# Patient Record
Sex: Male | Born: 1955 | Race: White | Hispanic: No | Marital: Single | Smoking: Never smoker
Health system: Southern US, Community
[De-identification: ages and names within clinical notes are randomized; demographics above are authoritative.]

## PROBLEM LIST (undated history)

## (undated) DIAGNOSIS — I10 Essential (primary) hypertension: Secondary | ICD-10-CM

## (undated) DIAGNOSIS — G4730 Sleep apnea, unspecified: Secondary | ICD-10-CM

## (undated) DIAGNOSIS — E785 Hyperlipidemia, unspecified: Secondary | ICD-10-CM

## (undated) HISTORY — DX: Hyperlipidemia, unspecified: E78.5

## (undated) HISTORY — PX: CHOLECYSTECTOMY: SHX55

---

## 2006-12-31 LAB — HM COLONOSCOPY

## 2010-12-31 HISTORY — PX: HERNIA REPAIR: SHX51

## 2011-03-20 ENCOUNTER — Ambulatory Visit: Payer: Self-pay | Admitting: Surgery

## 2011-03-27 ENCOUNTER — Ambulatory Visit: Payer: Self-pay | Admitting: Surgery

## 2011-08-06 ENCOUNTER — Ambulatory Visit: Payer: Self-pay | Admitting: Internal Medicine

## 2013-10-20 ENCOUNTER — Ambulatory Visit: Payer: Self-pay | Admitting: Internal Medicine

## 2013-11-04 ENCOUNTER — Ambulatory Visit: Payer: Self-pay | Admitting: Internal Medicine

## 2013-11-12 ENCOUNTER — Ambulatory Visit: Payer: Self-pay | Admitting: Internal Medicine

## 2013-12-31 LAB — LIPID PANEL: LDL Cholesterol: 82 mg/dL

## 2014-11-30 ENCOUNTER — Ambulatory Visit: Payer: Self-pay | Admitting: Internal Medicine

## 2014-11-30 LAB — LIPID PANEL
CHOLESTEROL: 142 mg/dL (ref 0–200)
HDL: 37 mg/dL (ref 35–70)
Triglycerides: 115 mg/dL (ref 40–160)

## 2014-11-30 LAB — PSA: PSA: 2.1

## 2015-04-06 ENCOUNTER — Encounter: Payer: Self-pay | Admitting: Internal Medicine

## 2015-04-06 DIAGNOSIS — I10 Essential (primary) hypertension: Secondary | ICD-10-CM | POA: Insufficient documentation

## 2015-04-06 DIAGNOSIS — Z86010 Personal history of colonic polyps: Secondary | ICD-10-CM | POA: Insufficient documentation

## 2015-04-06 DIAGNOSIS — M170 Bilateral primary osteoarthritis of knee: Secondary | ICD-10-CM | POA: Insufficient documentation

## 2015-04-06 DIAGNOSIS — J309 Allergic rhinitis, unspecified: Secondary | ICD-10-CM | POA: Insufficient documentation

## 2015-04-06 DIAGNOSIS — G4733 Obstructive sleep apnea (adult) (pediatric): Secondary | ICD-10-CM | POA: Insufficient documentation

## 2015-04-06 DIAGNOSIS — F324 Major depressive disorder, single episode, in partial remission: Secondary | ICD-10-CM | POA: Insufficient documentation

## 2015-04-06 DIAGNOSIS — Z8249 Family history of ischemic heart disease and other diseases of the circulatory system: Secondary | ICD-10-CM | POA: Insufficient documentation

## 2015-04-06 DIAGNOSIS — N138 Other obstructive and reflux uropathy: Secondary | ICD-10-CM | POA: Insufficient documentation

## 2015-04-06 DIAGNOSIS — N401 Enlarged prostate with lower urinary tract symptoms: Secondary | ICD-10-CM

## 2015-04-06 DIAGNOSIS — L209 Atopic dermatitis, unspecified: Secondary | ICD-10-CM | POA: Insufficient documentation

## 2015-06-01 ENCOUNTER — Other Ambulatory Visit: Payer: Self-pay | Admitting: Internal Medicine

## 2015-06-01 ENCOUNTER — Encounter: Payer: Self-pay | Admitting: Internal Medicine

## 2015-06-01 ENCOUNTER — Ambulatory Visit (INDEPENDENT_AMBULATORY_CARE_PROVIDER_SITE_OTHER): Payer: No Typology Code available for payment source | Admitting: Internal Medicine

## 2015-06-01 VITALS — BP 122/86 | HR 88 | Ht 72.0 in | Wt 222.0 lb

## 2015-06-01 DIAGNOSIS — I10 Essential (primary) hypertension: Secondary | ICD-10-CM

## 2015-06-01 DIAGNOSIS — E785 Hyperlipidemia, unspecified: Secondary | ICD-10-CM | POA: Diagnosis not present

## 2015-06-01 DIAGNOSIS — M170 Bilateral primary osteoarthritis of knee: Secondary | ICD-10-CM

## 2015-06-01 DIAGNOSIS — L309 Dermatitis, unspecified: Secondary | ICD-10-CM | POA: Diagnosis not present

## 2015-06-01 DIAGNOSIS — F324 Major depressive disorder, single episode, in partial remission: Secondary | ICD-10-CM

## 2015-06-01 MED ORDER — ATORVASTATIN CALCIUM 10 MG PO TABS: 10.0000 mg | ORAL_TABLET | Freq: Every day | ORAL | Status: DC

## 2015-06-01 NOTE — Progress Notes (Signed)
Date:  06/01/2015   Name:  Alexander Patterson   DOB:  June 18, 1955   MRN:  [id number]  PCP:  Bari EdwardLaura Zeina Akkerman, MD    Chief Complaint: Hypertension; Depression; and Osteoarthritis   History of Present Illness:  This is a 60 y.o. male who is presenting for routine followup of medical problems.  Hypertension - currently taking losartan daily. No medication side effects.  No concerns about it.  If he misses a few days he does not have chest pain,  SOB, or edema.  He estimates that he takes it most of the time.  Depression - being treated with bupropion.  If he forgets to take it he gets agitated.  Overall he is satisfied with his response.  No problems noted. Some sleep issues related to general anxiety.  This is treated with advil PM.  Osteoarthritis of knees -  Stable symptoms - knees hurt on a daily basis.  Just painful - no swelling or redness. No instability but occasionally gives way.  Mobic helps and does not cause any stomach upset.  He takes a probiotic to help protect his stomach.  Hyperlipidemia - stopped lipitor for unclear reasons.  He is willing to resume it because of risk and family history.  Review of Systems:  Review of Systems  Constitutional: Negative for appetite change and fatigue.  Respiratory: Negative for cough, choking and shortness of breath.   Cardiovascular: Negative for chest pain, palpitations and leg swelling.  Gastrointestinal: Negative for abdominal pain.  Genitourinary: Positive for urgency (some nocturnal urination decreased after taking Saw palmetto).  Musculoskeletal: Positive for arthralgias (both knees - dx'd as OA). Negative for back pain, joint swelling and gait problem.  Neurological: Negative for light-headedness and headaches.  Psychiatric/Behavioral: Positive for sleep disturbance (responds to otc sleep aids). Negative for hallucinations and agitation. The patient is nervous/anxious and is hyperactive.     Patient Active Problem List   Diagnosis  Date Noted  . Hyperlipidemia 06/01/2015  . H/O adenomatous polyp of colon 04/06/2015  . Essential (primary) hypertension 04/06/2015  . AD (atopic dermatitis) 04/06/2015  . Allergic rhinitis 04/06/2015  . Family history of coronary arteriosclerosis 04/06/2015  . H/O disease 04/06/2015  . Depression, major, single episode, in partial remission 04/06/2015  . Obstructive apnea 04/06/2015  . Primary osteoarthritis of both knees 04/06/2015    Prior to Admission medications   Medication Sig Start Date End Date Taking? Authorizing Provider  buPROPion (WELLBUTRIN XL) 150 MG 24 hr tablet Take 1 tablet by mouth daily. 05/13/15  Yes Historical Provider, MD  fluticasone (FLONASE) 50 MCG/ACT nasal spray Place 2 sprays into the nose daily. 11/30/14  Yes Historical Provider, MD  losartan (COZAAR) 100 MG tablet Take 1 tablet by mouth daily. 11/01/14  Yes Historical Provider, MD  meloxicam (MOBIC) 15 MG tablet Take 1 tablet by mouth daily. 11/01/14  Yes Historical Provider, MD  saw palmetto 160 MG capsule Take 160 mg by mouth 2 (two) times daily.   Yes Historical Provider, MD  atorvastatin (LIPITOR) 10 MG tablet Take 1 tablet by mouth at bedtime. 11/30/14   Historical Provider, MD    No Known Allergies  Past Surgical History  Procedure Laterality Date  . Cholecystectomy    . Hernia repair Right 2012    inguinal    History  Substance Use Topics  . Smoking status: Never Smoker   . Smokeless tobacco: Not on file  . Alcohol Use: 0.0 oz/week    0 Standard drinks or equivalent per  week     Comment: occasional    Family History  Problem Relation Age of Onset  . Subarachnoid hemorrhage Mother   . Heart disease Father   . Diabetes Father   . Diabetes Sister     Medication list has been reviewed and updated.  Physical Examination:  Physical Exam  Constitutional: He is oriented to person, place, and time. He appears well-developed. No distress.  HENT:  Head: Normocephalic and atraumatic.   Neck: Normal range of motion. Neck supple.  Cardiovascular: Normal rate, regular rhythm and normal heart sounds.   Pulmonary/Chest: Effort normal. No respiratory distress. He has no wheezes. He has no rales.  Musculoskeletal: Normal range of motion. He exhibits no edema or tenderness.  Lymphadenopathy:    He has no cervical adenopathy.  Neurological: He is alert and oriented to person, place, and time.  Skin: Skin is warm and dry. Rash (palmar eczema) noted.  Psychiatric: He has a normal mood and affect. His behavior is normal. Thought content normal.    BP 122/86 mmHg  Pulse 88  Ht 6' (1.829 m)  Wt 222 lb (100.699 kg)  BMI 30.10 kg/m2  Assessment and Plan:  1. Hyperlipidemia Needs to resume statin therapy to reduce ASCVD risk. Will check labs at [facility] in December. - atorvastatin (LIPITOR) 10 MG tablet; Take 1 tablet (10 mg total) by mouth at bedtime.  Dispense: 30 tablet; Refill: 5  2. Essential (primary) hypertension Controlled - continue current therapy.  3. Primary osteoarthritis of both knees Continue meloxicam.  If worsening will refer to Orthopedics.  4. Depression, major, single episode, in partial remission Controlled - continue current therapy with bupropion.

## 2015-06-01 NOTE — Patient Instructions (Signed)
DASH Eating Plan °DASH stands for "Dietary Approaches to Stop Hypertension." The DASH eating plan is a healthy eating plan that has been shown to reduce high blood pressure (hypertension). Additional health benefits may include reducing the risk of type 2 diabetes mellitus, heart disease, and stroke. The DASH eating plan may also help with weight loss. °WHAT DO I NEED TO KNOW ABOUT THE DASH EATING PLAN? °For the DASH eating plan, you will follow these general guidelines: °· Choose foods with a percent daily value for sodium of less than 5% (as listed on the food label). °· Use salt-free seasonings or herbs instead of table salt or sea salt. °· Check with your health care provider or pharmacist before using salt substitutes. °· Eat lower-sodium products, often labeled as "lower sodium" or "no salt added." °· Eat fresh foods. °· Eat more vegetables, fruits, and low-fat dairy products. °· Choose whole grains. Look for the word "whole" as the first word in the ingredient list. °· Choose fish and skinless chicken or turkey more often than red meat. Limit fish, poultry, and meat to 6 oz (170 g) each day. °· Limit sweets, desserts, sugars, and sugary drinks. °· Choose heart-healthy fats. °· Limit cheese to 1 oz (28 g) per day. °· Eat more home-cooked food and less restaurant, buffet, and fast food. °· Limit fried foods. °· Cook foods using methods other than frying. °· Limit canned vegetables. If you do use them, rinse them well to decrease the sodium. °· When eating at a restaurant, ask that your food be prepared with less salt, or no salt if possible. °WHAT FOODS CAN I EAT? °Seek help from a dietitian for individual calorie needs. °Grains °Whole grain or whole wheat bread. Brown rice. Whole grain or whole wheat pasta. Quinoa, bulgur, and whole grain cereals. Low-sodium cereals. Corn or whole wheat flour tortillas. Whole grain cornbread. Whole grain crackers. Low-sodium crackers. °Vegetables °Fresh or frozen vegetables  (raw, steamed, roasted, or grilled). Low-sodium or reduced-sodium tomato and vegetable juices. Low-sodium or reduced-sodium tomato sauce and paste. Low-sodium or reduced-sodium canned vegetables.  °Fruits °All fresh, canned (in natural juice), or frozen fruits. °Meat and Other Protein Products °Ground beef (85% or leaner), grass-fed beef, or beef trimmed of fat. Skinless chicken or turkey. Ground chicken or turkey. Pork trimmed of fat. All fish and seafood. Eggs. Dried beans, peas, or lentils. Unsalted nuts and seeds. Unsalted canned beans. °Dairy °Low-fat dairy products, such as skim or 1% milk, 2% or reduced-fat cheeses, low-fat ricotta or cottage cheese, or plain low-fat yogurt. Low-sodium or reduced-sodium cheeses. °Fats and Oils °Tub margarines without trans fats. Light or reduced-fat mayonnaise and salad dressings (reduced sodium). Avocado. Safflower, olive, or canola oils. Natural peanut or almond butter. °Other °Unsalted popcorn and pretzels. °The items listed above may not be a complete list of recommended foods or beverages. Contact your dietitian for more options. °WHAT FOODS ARE NOT RECOMMENDED? °Grains °White bread. White pasta. White rice. Refined cornbread. Bagels and croissants. Crackers that contain trans fat. °Vegetables °Creamed or fried vegetables. Vegetables in a cheese sauce. Regular canned vegetables. Regular canned tomato sauce and paste. Regular tomato and vegetable juices. °Fruits °Dried fruits. Canned fruit in light or heavy syrup. Fruit juice. °Meat and Other Protein Products °Fatty cuts of meat. Ribs, chicken wings, bacon, sausage, bologna, salami, chitterlings, fatback, hot dogs, bratwurst, and packaged luncheon meats. Salted nuts and seeds. Canned beans with salt. °Dairy °Whole or 2% milk, cream, half-and-half, and cream cheese. Whole-fat or sweetened yogurt. Full-fat   cheeses or blue cheese. Nondairy creamers and whipped toppings. Processed cheese, cheese spreads, or cheese  curds. °Condiments °Onion and garlic salt, seasoned salt, table salt, and sea salt. Canned and packaged gravies. Worcestershire sauce. Tartar sauce. Barbecue sauce. Teriyaki sauce. Soy sauce, including reduced sodium. Steak sauce. Fish sauce. Oyster sauce. Cocktail sauce. Horseradish. Ketchup and mustard. Meat flavorings and tenderizers. Bouillon cubes. Hot sauce. Tabasco sauce. Marinades. Taco seasonings. Relishes. °Fats and Oils °Butter, stick margarine, lard, shortening, ghee, and bacon fat. Coconut, palm kernel, or palm oils. Regular salad dressings. °Other °Pickles and olives. Salted popcorn and pretzels. °The items listed above may not be a complete list of foods and beverages to avoid. Contact your dietitian for more information. °WHERE CAN I FIND MORE INFORMATION? °National [employer], [employer], [employer]: www.nhlbi.nih.gov/health/health-topics/topics/dash/ °Document Released: 12/06/2011 Document Revised: 05/03/2014 Document Reviewed: 10/21/2013 °ExitCare® Patient Information ©2015 [employer]. This information is not intended to replace advice given to you by your health care provider. Make sure you discuss any questions you have with your health care provider. ° °

## 2015-09-28 ENCOUNTER — Other Ambulatory Visit: Payer: Self-pay | Admitting: Internal Medicine

## 2015-09-28 DIAGNOSIS — G4733 Obstructive sleep apnea (adult) (pediatric): Secondary | ICD-10-CM

## 2015-10-11 ENCOUNTER — Encounter: Payer: Self-pay | Admitting: Internal Medicine

## 2015-10-11 ENCOUNTER — Ambulatory Visit (INDEPENDENT_AMBULATORY_CARE_PROVIDER_SITE_OTHER): Payer: No Typology Code available for payment source | Admitting: Internal Medicine

## 2015-10-11 ENCOUNTER — Ambulatory Visit
Admission: RE | Admit: 2015-10-11 | Discharge: 2015-10-11 | Disposition: A | Discharge status: Home / Self Care | Payer: No Typology Code available for payment source | Source: Ambulatory Visit | Attending: Internal Medicine | Admitting: Internal Medicine

## 2015-10-11 VITALS — BP 130/84 | HR 84 | Ht 72.0 in | Wt 222.0 lb

## 2015-10-11 DIAGNOSIS — M79671 Pain in right foot: Secondary | ICD-10-CM

## 2015-10-11 NOTE — Progress Notes (Signed)
Date:  10/11/2015   Name:  Alexander Patterson   DOB:  20-Feb-1955   MRN:  [id number]   Chief Complaint: Foot Pain  Patient had onset of right foot pain about 3 days ago. He does not recall any specific injury but he was doing a lot of working caring heavy steel. Been taking meloxicam without any relief. He's tried various support shoes with different stiffness without any change. It hurts to bear weight and also to move the foot in dorsiflexion. Slight swelling but no redness. No history of gout.   Review of Systems  Constitutional: Negative for fever and chills.  Respiratory: Negative for shortness of breath.   Cardiovascular: Negative for chest pain and leg swelling.  Musculoskeletal: Positive for arthralgias and gait problem.  Neurological: Negative for weakness and numbness.    Patient Active Problem List   Diagnosis Date Noted  . Hyperlipidemia 06/01/2015  . 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 H/O adenomatous polyp of colon 04/06/2015  . Essential (primary) hypertension 04/06/2015  . AD (atopic dermatitis) 04/06/2015  . Allergic rhinitis 04/06/2015  . Family history of coronary arteriosclerosis 04/06/2015  . H/O disease 04/06/2015  . Depression, major, single episode, in partial remission (HCC) 04/06/2015  . Obstructive apnea 04/06/2015  . Primary osteoarthritis of both knees 04/06/2015    Prior to Admission medications   Medication Sig Start Date End Date Taking? Authorizing Provider  atorvastatin (LIPITOR) 10 MG tablet Take 1 tablet (10 mg total) by mouth at bedtime. 06/01/15  Yes Reubin Milan, MD  buPROPion (WELLBUTRIN XL) 150 MG 24 hr tablet Take 1 tablet by mouth daily. 05/13/15  Yes Historical Provider, MD  fluticasone (FLONASE) 50 MCG/ACT nasal spray Place 2 sprays into the nose daily. 11/30/14  Yes Historical Provider, MD  losartan (COZAAR) 100 MG tablet Take 1 tablet by mouth daily. 11/01/14  Yes Historical Provider, MD  meloxicam (MOBIC) 15 MG tablet Take 1 tablet by mouth  daily. 11/01/14  Yes Historical Provider, MD  saw palmetto 160 MG capsule Take 160 mg by mouth 2 (two) times daily.   Yes Historical Provider, MD    No Known Allergies  Past Surgical History  Procedure Laterality Date  . Cholecystectomy    . Hernia repair Right 2012    inguinal    Social History  Substance Use Topics  . Smoking status: Never Smoker   . Smokeless tobacco: None  . Alcohol Use: 0.0 oz/week    0 Standard drinks or equivalent per week     Comment: occasional     Medication list has been reviewed and updated.   Physical Exam  Constitutional: He appears well-developed and well-nourished.  Musculoskeletal:       Right ankle: He exhibits decreased range of motion. He exhibits normal pulse. Tenderness.       Feet:  Psychiatric: He has a normal mood and affect.  Nursing note and vitals reviewed.   BP 130/84 mmHg  Pulse 84  Ht 6' (1.829 m)  Wt 222 lb (100.699 kg)  BMI 30.10 kg/m2  Assessment and Plan: 1. Foot pain, right Concern for possible stress fracture Continue meloxicam and will notify him of the x-ray results - DG Foot Complete Right; Future   Bari Edward, MD [facility]  10/11/2015

## 2015-11-16 ENCOUNTER — Encounter: Payer: Self-pay | Admitting: Internal Medicine

## 2015-11-16 ENCOUNTER — Ambulatory Visit (INDEPENDENT_AMBULATORY_CARE_PROVIDER_SITE_OTHER): Payer: No Typology Code available for payment source | Admitting: Internal Medicine

## 2015-11-16 VITALS — BP 136/78 | HR 78 | Temp 98.4°F | Ht 72.0 in | Wt 228.0 lb

## 2015-11-16 DIAGNOSIS — J019 Acute sinusitis, unspecified: Secondary | ICD-10-CM | POA: Diagnosis not present

## 2015-11-16 MED ORDER — AMOXICILLIN-POT CLAVULANATE 875-125 MG PO TABS: 1.0000 | ORAL_TABLET | Freq: Two times a day (BID) | ORAL | Status: DC

## 2015-11-16 MED ORDER — FLUTICASONE PROPIONATE 50 MCG/ACT NA SUSP: 2.0000 | Freq: Every day | NASAL | Status: AC

## 2015-11-16 NOTE — Progress Notes (Signed)
Date:  11/16/2015   Name:  Alexander Patterson   DOB:  1955-11-01   MRN:  [id number]   Chief Complaint: Sinusitis and Cough  Patient presents today with complaints of a sinus infection with drainage and cough. He uses Flonase nasal spray.   Review of Systems  HENT: Positive for rhinorrhea, sinus pressure and sore throat. Negative for ear discharge, ear pain and tinnitus.   Respiratory: Positive for cough. Negative for chest tightness, shortness of breath and wheezing.   Cardiovascular: Negative for chest pain and palpitations.  Neurological: Positive for headaches. Negative for dizziness.    Patient Active Problem List   Diagnosis Date Noted  . Hyperlipidemia 06/01/2015  . 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 H/O adenomatous polyp of colon 04/06/2015  . Essential (primary) hypertension 04/06/2015  . AD (atopic dermatitis) 04/06/2015  . Allergic rhinitis 04/06/2015  . Family history of coronary arteriosclerosis 04/06/2015  . H/O disease 04/06/2015  . Depression, major, single episode, in partial remission (HCC) 04/06/2015  . Obstructive apnea 04/06/2015  . Primary osteoarthritis of both knees 04/06/2015    Prior to Admission medications   Medication Sig Start Date End Date Taking? Authorizing Provider  atorvastatin (LIPITOR) 10 MG tablet Take 1 tablet (10 mg total) by mouth at bedtime. 06/01/15  Yes Reubin MilanLaura H Aurilla Coulibaly, MD  buPROPion (WELLBUTRIN XL) 150 MG 24 hr tablet Take 1 tablet by mouth daily. 05/13/15  Yes Historical Provider, MD  fluticasone (FLONASE) 50 MCG/ACT nasal spray Place 2 sprays into the nose daily. 11/30/14  Yes Historical Provider, MD  losartan (COZAAR) 100 MG tablet Take 1 tablet by mouth daily. 11/01/14  Yes Historical Provider, MD  meloxicam (MOBIC) 15 MG tablet Take 1 tablet by mouth daily. 11/01/14  Yes Historical Provider, MD  saw palmetto 160 MG capsule Take 160 mg by mouth 2 (two) times daily.   Yes Historical Provider, MD    No Known Allergies  Past Surgical History   Procedure Laterality Date  . Cholecystectomy    . Hernia repair Right 2012    inguinal    Social History  Substance Use Topics  . Smoking status: Never Smoker   . Smokeless tobacco: None  . Alcohol Use: 0.0 oz/week    0 Standard drinks or equivalent per week     Comment: occasional    Medication list has been reviewed and updated.   Physical Exam  Constitutional: He is oriented to person, place, and time. He appears well-developed and well-nourished.  HENT:  Right Ear: External ear and ear canal normal. Tympanic membrane is not erythematous and not retracted.  Left Ear: External ear and ear canal normal. Tympanic membrane is not erythematous and not retracted.  Nose: Right sinus exhibits maxillary sinus tenderness and frontal sinus tenderness. Left sinus exhibits maxillary sinus tenderness and frontal sinus tenderness.  Mouth/Throat: Uvula is midline and mucous membranes are normal. No oral lesions. Posterior oropharyngeal erythema present. No oropharyngeal exudate.  Cardiovascular: Normal rate, regular rhythm and normal heart sounds.   Pulmonary/Chest: Effort normal and breath sounds normal. He has no wheezes. He has no rales.  Lymphadenopathy:    He has no cervical adenopathy.  Neurological: He is alert and oriented to person, place, and time.  Nursing note and vitals reviewed.   BP 136/78 mmHg  Pulse 78  Temp(Src) 98.4 F (36.9 C)  Ht 6' (1.829 m)  Wt 228 lb (103.42 kg)  BMI 30.92 kg/m2  SpO2 97%  Assessment and Plan: 1. Acute sinusitis, recurrence not specified,  unspecified location Get an over-the-counter decongestant such as Sudafed and nasal spray such as Afrin - amoxicillin-clavulanate (AUGMENTIN) 875-125 MG tablet; Take 1 tablet by mouth 2 (two) times daily.  Dispense: 20 tablet; Refill: 0 - fluticasone (FLONASE) 50 MCG/ACT nasal spray; Place 2 sprays into both nostrils daily.  Dispense: 16 g; Refill: 2   Bari Edward, MD [facility] [place] [facility]  11/16/2015

## 2015-12-02 ENCOUNTER — Encounter: Payer: No Typology Code available for payment source | Admitting: Internal Medicine

## 2015-12-27 ENCOUNTER — Encounter: Payer: Self-pay | Admitting: Internal Medicine

## 2015-12-28 ENCOUNTER — Ambulatory Visit (INDEPENDENT_AMBULATORY_CARE_PROVIDER_SITE_OTHER): Payer: Self-pay | Admitting: Internal Medicine

## 2015-12-28 ENCOUNTER — Encounter: Payer: Self-pay | Admitting: Internal Medicine

## 2015-12-28 VITALS — BP 126/86 | HR 76 | Ht 72.0 in | Wt 226.0 lb

## 2015-12-28 DIAGNOSIS — Z0000 Encounter for general adult medical examination without abnormal findings: Secondary | ICD-10-CM

## 2015-12-28 DIAGNOSIS — G4700 Insomnia, unspecified: Secondary | ICD-10-CM

## 2015-12-28 DIAGNOSIS — G4733 Obstructive sleep apnea (adult) (pediatric): Secondary | ICD-10-CM

## 2015-12-28 DIAGNOSIS — Z125 Encounter for screening for malignant neoplasm of prostate: Secondary | ICD-10-CM

## 2015-12-28 DIAGNOSIS — J019 Acute sinusitis, unspecified: Secondary | ICD-10-CM

## 2015-12-28 DIAGNOSIS — Z1159 Encounter for screening for other viral diseases: Secondary | ICD-10-CM

## 2015-12-28 DIAGNOSIS — E785 Hyperlipidemia, unspecified: Secondary | ICD-10-CM

## 2015-12-28 DIAGNOSIS — F324 Major depressive disorder, single episode, in partial remission: Secondary | ICD-10-CM

## 2015-12-28 DIAGNOSIS — I10 Essential (primary) hypertension: Secondary | ICD-10-CM

## 2015-12-28 DIAGNOSIS — Z114 Encounter for screening for human immunodeficiency virus [HIV]: Secondary | ICD-10-CM

## 2015-12-28 MED ORDER — ZOLPIDEM TARTRATE ER 6.25 MG PO TBCR: 6.2500 mg | EXTENDED_RELEASE_TABLET | Freq: Every evening | ORAL | Status: DC | PRN

## 2015-12-28 MED ORDER — AMOXICILLIN-POT CLAVULANATE 875-125 MG PO TABS: 1.0000 | ORAL_TABLET | Freq: Two times a day (BID) | ORAL | Status: DC

## 2015-12-28 NOTE — Progress Notes (Signed)
Date:  12/28/2015   Name:  Alexander Patterson   DOB:  Oct 18, 1955   MRN:  [id number]   Chief Complaint: Annual Exam; Hypertension; Hyperlipidemia; and Depression Alexander Patterson is a 60 y.o. male who presents today for his Complete Annual Exam. He feels well. He reports exercising intermittently. He reports he is sleeping poorly. He attributes this to moving in with his girlfriend and adapting to sleeping together. He has sleep apnea but needs a repeat study to get a titration and new machine. He has not been able to afford this yet.  Hypertension This is a chronic problem. The current episode started more than 1 year ago. The problem is unchanged. The problem is controlled. Pertinent negatives include no chest pain, headaches, palpitations or shortness of breath. Past treatments include angiotensin blockers. The current treatment provides significant improvement.  Hyperlipidemia This is a chronic problem. The current episode started more than 1 year ago. The problem is controlled. Recent lipid tests were reviewed and are normal. Pertinent negatives include no chest pain, focal weakness, leg pain, myalgias or shortness of breath. Current antihyperlipidemic treatment includes statins. The current treatment provides significant improvement of lipids. There are no compliance problems.   Depression        This is a chronic problem.  The current episode started more than 1 year ago.   The onset quality is gradual.   The problem occurs intermittently.  The problem has been gradually improving since onset.  Associated symptoms include insomnia.  Associated symptoms include no fatigue, no myalgias and no headaches.     The symptoms are aggravated by work stress.  Treatments tried: bupropion with good results.  Compliance with treatment is good. Insomnia Primary symptoms: sleep disturbance, frequent awakening, premature morning awakening.  The current episode started 1 to 4 weeks ago. The onset quality  is gradual. The problem occurs nightly. The problem has been waxing and waning since onset. PMH includes: depression.  Sinusitis This is a recurrent problem. The current episode started in the past 7 days. The problem is unchanged. There has been no fever. Associated symptoms include congestion, sinus pressure and a sore throat. Pertinent negatives include no chills, coughing, headaches or shortness of breath. Past treatments include saline sprays. The treatment provided mild relief.    Review of Systems  Constitutional: Negative for fever, chills, fatigue and unexpected weight change.  HENT: Positive for congestion, sinus pressure and sore throat. Negative for hearing loss, tinnitus, trouble swallowing and voice change.   Eyes: Negative for visual disturbance.  Respiratory: Negative for cough, chest tightness, shortness of breath and wheezing.   Cardiovascular: Negative for chest pain, palpitations and leg swelling.  Gastrointestinal: Negative for abdominal pain, diarrhea and constipation.  Genitourinary: Negative for dysuria, urgency, frequency, hematuria and difficulty urinating.  Musculoskeletal: Positive for arthralgias. Negative for myalgias, back pain and gait problem.  Skin: Negative for color change and rash.  Neurological: Negative for dizziness, tremors, focal weakness, light-headedness and headaches.  Hematological: Negative for adenopathy.  Psychiatric/Behavioral: Positive for depression and sleep disturbance. Negative for dysphoric mood. The patient has insomnia.     Patient Active Problem List   Diagnosis Date Noted  . Hyperlipidemia 06/01/2015  . 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 H/O adenomatous polyp of colon 04/06/2015  . Essential (primary) hypertension 04/06/2015  . AD (atopic dermatitis) 04/06/2015  . Allergic rhinitis 04/06/2015  . Family history of coronary arteriosclerosis 04/06/2015  . 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 Depression,  major,  single episode, in partial remission (HCC) 04/06/2015  . Obstructive apnea 04/06/2015  . Primary osteoarthritis of both knees 04/06/2015    Prior to Admission medications   Medication Sig Start Date End Date Taking? Authorizing Provider  atorvastatin (LIPITOR) 10 MG tablet Take 1 tablet (10 mg total) by mouth at bedtime. 06/01/15  Yes Reubin MilanLaura H Cassondra Stachowski, MD  buPROPion (WELLBUTRIN XL) 150 MG 24 hr tablet Take 1 tablet by mouth daily. 05/13/15  Yes Historical Provider, MD  fluticasone (FLONASE) 50 MCG/ACT nasal spray Place 2 sprays into both nostrils daily. 11/16/15  Yes Reubin MilanLaura H Arik Husmann, MD  losartan (COZAAR) 100 MG tablet Take 1 tablet by mouth daily. 11/01/14  Yes Historical Provider, MD  meloxicam (MOBIC) 15 MG tablet Take 1 tablet by mouth daily. 11/01/14  Yes Historical Provider, MD  saw palmetto 160 MG capsule Take 160 mg by mouth 2 (two) times daily.   Yes Historical Provider, MD    No Known Allergies  Past Surgical History  Procedure Laterality Date  . Cholecystectomy    . Hernia repair Right 2012    inguinal    Social History  Substance Use Topics  . Smoking status: Never Smoker   . Smokeless tobacco: None  . Alcohol Use: 0.0 oz/week    0 Standard drinks or equivalent per week     Comment: occasional   Lab Results  Component Value Date   CHOL 142 11/30/2014   HDL 37 11/30/2014   LDLCALC 82 12/31/2013   TRIG 115 11/30/2014    Medication list has been reviewed and updated.  Physical Exam  Constitutional: He is oriented to person, place, and time. He appears well-developed and well-nourished.  HENT:  Head: Normocephalic.  Right Ear: Tympanic membrane, external ear and ear canal normal. Tympanic membrane is not erythematous and not retracted.  Left Ear: Tympanic membrane, external ear and ear canal normal. Tympanic membrane is not erythematous and not retracted.  Nose: Right sinus exhibits maxillary sinus tenderness. Right sinus exhibits no frontal sinus tenderness. Left  sinus exhibits maxillary sinus tenderness. Left sinus exhibits no frontal sinus tenderness.  Mouth/Throat: Uvula is midline and oropharynx is clear and moist.  Eyes: Conjunctivae and EOM are normal. Pupils are equal, round, and reactive to light.  Neck: Normal range of motion. Neck supple. Carotid bruit is not present. No thyromegaly present.  Cardiovascular: Normal rate, regular rhythm, normal heart sounds and intact distal pulses.   Pulmonary/Chest: Effort normal and breath sounds normal. He has no wheezes. Right breast exhibits no mass. Left breast exhibits no mass.  Abdominal: Soft. Normal appearance and bowel sounds are normal. There is no hepatosplenomegaly. There is no tenderness.  Musculoskeletal: Normal range of motion.  Lymphadenopathy:    He has no cervical adenopathy.  Neurological: He is alert and oriented to person, place, and time. He has normal reflexes.  Skin: Skin is warm, dry and intact.  Psychiatric: He has a normal mood and affect. His speech is normal and behavior is normal. Judgment and thought content normal.  Nursing note and vitals reviewed.   BP 126/86 mmHg  Pulse 76  Ht 6' (1.829 m)  Wt 226 lb (102.513 kg)  BMI 30.64 kg/m2  Assessment and Plan: 1. Annual physical exam Discussed regular exercise for weight and BP control - POCT urinalysis dipstick  2. Essential (primary) hypertension Controlled on current medication - CBC with Differential/Platelet - Comprehensive metabolic panel - TSH  3. Obstructive apnea Needs CPAP - consider referral for titration in  the new year  4. Hyperlipidemia On statin therapy - Lipid panel  5. Depression, major, single episode, in partial remission (HCC) Doing well on bupropion  6. Need for hepatitis C screening test - Hepatitis C antibody  7. Encounter for screening for HIV - HIV antibody  8. Prostate cancer screening DRE deferred due to lack of symptoms - PSA  9. Acute sinusitis, recurrence not specified,  unspecified location Continue Flonase nasal spray - amoxicillin-clavulanate (AUGMENTIN) 875-125 MG tablet; Take 1 tablet by mouth 2 (two) times daily.  Dispense: 20 tablet; Refill: 0  10. Insomnia Trial of Ambien CR  - zolpidem (AMBIEN CR) 6.25 MG CR tablet; Take 1 tablet (6.25 mg total) by mouth at bedtime as needed for sleep.  Dispense: 30 tablet; Refill: 0   Bari Edward, MD [facility]  12/28/2015

## 2015-12-29 LAB — CBC WITH DIFFERENTIAL/PLATELET
BASOS: 0 %
Basophils Absolute: 0 10*3/uL (ref 0.0–0.2)
EOS (ABSOLUTE): 0.4 10*3/uL (ref 0.0–0.4)
EOS: 7 %
HEMATOCRIT: 45.6 % (ref 37.5–51.0)
Hemoglobin: 15.6 g/dL (ref 12.6–17.7)
Immature Grans (Abs): 0 10*3/uL (ref 0.0–0.1)
Immature Granulocytes: 1 %
LYMPHS ABS: 1.6 10*3/uL (ref 0.7–3.1)
Lymphs: 26 %
MCH: 31.7 pg (ref 26.6–33.0)
MCHC: 34.2 g/dL (ref 31.5–35.7)
MCV: 93 fL (ref 79–97)
MONOS ABS: 0.6 10*3/uL (ref 0.1–0.9)
Monocytes: 10 %
Neutrophils Absolute: 3.7 10*3/uL (ref 1.4–7.0)
Neutrophils: 56 %
Platelets: 209 10*3/uL (ref 150–379)
RBC: 4.92 x10E6/uL (ref 4.14–5.80)
RDW: 13.5 % (ref 12.3–15.4)
WBC: 6.4 10*3/uL (ref 3.4–10.8)

## 2015-12-29 LAB — COMPREHENSIVE METABOLIC PANEL
A/G RATIO: 1.8 (ref 1.1–2.5)
ALK PHOS: 74 IU/L (ref 39–117)
ALT: 22 IU/L (ref 0–44)
AST: 18 IU/L (ref 0–40)
Albumin: 4.5 g/dL (ref 3.5–5.5)
BUN / CREAT RATIO: 21 — AB (ref 9–20)
BUN: 15 mg/dL (ref 6–24)
Bilirubin Total: 0.4 mg/dL (ref 0.0–1.2)
CALCIUM: 9.1 mg/dL (ref 8.7–10.2)
CO2: 25 mmol/L (ref 18–29)
Chloride: 102 mmol/L (ref 96–106)
Creatinine, Ser: 0.72 mg/dL — ABNORMAL LOW (ref 0.76–1.27)
GFR calc Af Amer: 118 mL/min/{1.73_m2} (ref >59)
GFR, EST NON AFRICAN AMERICAN: 102 mL/min/{1.73_m2} (ref >59)
GLOBULIN, TOTAL: 2.5 g/dL (ref 1.5–4.5)
Glucose: 94 mg/dL (ref 65–99)
POTASSIUM: 4.3 mmol/L (ref 3.5–5.2)
SODIUM: 141 mmol/L (ref 134–144)
Total Protein: 7 g/dL (ref 6.0–8.5)

## 2015-12-29 LAB — LIPID PANEL
Chol/HDL Ratio: 4.6 ratio units (ref 0.0–5.0)
Cholesterol, Total: 153 mg/dL (ref 100–199)
HDL: 33 mg/dL — AB (ref >39)
LDL Calculated: 79 mg/dL (ref 0–99)
TRIGLYCERIDES: 204 mg/dL — AB (ref 0–149)
VLDL CHOLESTEROL CAL: 41 mg/dL — AB (ref 5–40)

## 2015-12-29 LAB — PSA: Prostate Specific Ag, Serum: 1.7 ng/mL (ref 0.0–4.0)

## 2015-12-29 LAB — TSH: TSH: 1.46 u[IU]/mL (ref 0.450–4.500)

## 2015-12-29 LAB — HEPATITIS C ANTIBODY: Hep C Virus Ab: <0.1 s/co ratio (ref 0.0–0.9)

## 2015-12-29 LAB — HIV ANTIBODY (ROUTINE TESTING W REFLEX): HIV Screen 4th Generation wRfx: NONREACTIVE

## 2016-01-11 ENCOUNTER — Ambulatory Visit: Payer: No Typology Code available for payment source

## 2016-01-31 ENCOUNTER — Ambulatory Visit (INDEPENDENT_AMBULATORY_CARE_PROVIDER_SITE_OTHER): Payer: Commercial Managed Care - HMO | Admitting: Internal Medicine

## 2016-01-31 ENCOUNTER — Encounter: Payer: Self-pay | Admitting: Internal Medicine

## 2016-01-31 VITALS — BP 130/84 | HR 87 | Temp 98.3°F | Ht 72.0 in | Wt 225.0 lb

## 2016-01-31 DIAGNOSIS — J40 Bronchitis, not specified as acute or chronic: Secondary | ICD-10-CM

## 2016-01-31 DIAGNOSIS — G4700 Insomnia, unspecified: Secondary | ICD-10-CM

## 2016-01-31 MED ORDER — TEMAZEPAM 15 MG PO CAPS: 15.0000 mg | ORAL_CAPSULE | Freq: Every evening | ORAL | Status: AC | PRN

## 2016-01-31 MED ORDER — HYDROCODONE-HOMATROPINE 5-1.5 MG/5ML PO SYRP: 5.0000 mL | ORAL_SOLUTION | Freq: Four times a day (QID) | ORAL | Status: DC | PRN

## 2016-01-31 MED ORDER — DOXYCYCLINE HYCLATE 100 MG PO TABS: 100.0000 mg | ORAL_TABLET | Freq: Two times a day (BID) | ORAL | Status: DC

## 2016-01-31 NOTE — Patient Instructions (Signed)

## 2016-01-31 NOTE — Progress Notes (Signed)
Date:  01/31/2016   Name:  Alexander Patterson   DOB:  05-20-1955   MRN:  [id number]   Chief Complaint: Sinusitis and Cough Sinusitis This is a recurrent problem. The current episode started 1 to 4 weeks ago. The problem is unchanged. There has been no fever (low grade). Associated symptoms include coughing, ear pain, headaches, shortness of breath and a sore throat. Pertinent negatives include no chills or diaphoresis. Past treatments include oral decongestants, saline sprays and spray decongestants.  Cough This is a new problem. The current episode started 1 to 4 weeks ago. The cough is non-productive (but rattling). Associated symptoms include ear congestion, ear pain, a fever, headaches, nasal congestion, postnasal drip, a sore throat and shortness of breath. Pertinent negatives include no chest pain, chills or wheezing.  Insomnia Primary symptoms: fragmented sleep, sleep disturbance, difficulty falling asleep.  The problem occurs every several days. The problem is unchanged. Past treatments include medication (ambien too expensive).     Review of Systems  Constitutional: Positive for fever. Negative for chills and diaphoresis.  HENT: Positive for ear pain, postnasal drip and sore throat.   Eyes: Negative for visual disturbance.  Respiratory: Positive for cough and shortness of breath. Negative for chest tightness and wheezing.   Cardiovascular: Negative for chest pain, palpitations and leg swelling.  Gastrointestinal: Negative for nausea, abdominal pain and diarrhea.  Neurological: Positive for light-headedness and headaches. Negative for dizziness and syncope.  Psychiatric/Behavioral: Positive for sleep disturbance. The patient has insomnia.     Patient Active Problem List   Diagnosis Date Noted  . Hyperlipidemia 06/01/2015  . 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 H/O adenomatous polyp of colon 04/06/2015  . Essential (primary) hypertension 04/06/2015  . AD (atopic dermatitis) 04/06/2015   . Allergic rhinitis 04/06/2015  . Family history of coronary arteriosclerosis 04/06/2015  . 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 Depression, major, single episode, in partial remission (HCC) 04/06/2015  . Obstructive apnea 04/06/2015  . Primary osteoarthritis of both knees 04/06/2015    Prior to Admission medications   Medication Sig Start Date End Date Taking? Authorizing Provider  atorvastatin (LIPITOR) 10 MG tablet Take 1 tablet (10 mg total) by mouth at bedtime. 06/01/15  Yes Reubin Milan, MD  buPROPion (WELLBUTRIN XL) 150 MG 24 hr tablet Take 1 tablet by mouth daily. 05/13/15  Yes Historical Provider, MD  fluticasone (FLONASE) 50 MCG/ACT nasal spray Place 2 sprays into both nostrils daily. 11/16/15  Yes Reubin Milan, MD  losartan (COZAAR) 100 MG tablet Take 1 tablet by mouth daily. 11/01/14  Yes Historical Provider, MD  meloxicam (MOBIC) 15 MG tablet Take 1 tablet by mouth daily. 11/01/14  Yes Historical Provider, MD  zolpidem (AMBIEN CR) 6.25 MG CR tablet Take 1 tablet (6.25 mg total) by mouth at bedtime as needed for sleep. 12/28/15  Yes Reubin Milan, MD    No Known Allergies  Past Surgical History  Procedure Laterality Date  . Cholecystectomy    . Hernia repair Right 2012    inguinal    Social History  Substance Use Topics  . Smoking status: Never Smoker   . Smokeless tobacco: None  . Alcohol Use: 0.0 oz/week    0 Standard drinks or equivalent per week     Comment: occasional     Medication list has been reviewed and updated.   Physical Exam  Constitutional: He is oriented to person, place, and time. He appears well-developed and well-nourished.  HENT:  Right Ear: External ear and ear canal normal. Tympanic membrane is not erythematous and not retracted.  Left Ear: External ear and ear canal normal. Tympanic membrane is not erythematous and not retracted.  Nose: Right sinus exhibits no maxillary sinus tenderness and no frontal  sinus tenderness. Left sinus exhibits no maxillary sinus tenderness and no frontal sinus tenderness.  Mouth/Throat: Uvula is midline and mucous membranes are normal. No oral lesions. No oropharyngeal exudate or posterior oropharyngeal erythema.  Neck: No thyromegaly present.  Cardiovascular: Normal rate, regular rhythm and normal heart sounds.   Pulmonary/Chest: Effort normal. He has decreased breath sounds. He has no wheezes. He has no rales.  Lymphadenopathy:    He has no cervical adenopathy.  Neurological: He is alert and oriented to person, place, and time.  Psychiatric: He has a normal mood and affect.    BP 130/84 mmHg  Pulse 87  Temp(Src) 98.3 F (36.8 C) (Oral)  Ht 6' (1.829 m)  Wt 225 lb (102.059 kg)  BMI 30.51 kg/m2  SpO2 97%  Assessment and Plan: 1. Bronchitis Increase fluids - doxycycline (VIBRA-TABS) 100 MG tablet; Take 1 tablet (100 mg total) by mouth 2 (two) times daily.  Dispense: 28 tablet; Refill: 0 - HYDROcodone-homatropine (HYCODAN) 5-1.5 MG/5ML syrup; Take 5 mLs by mouth every 6 (six) hours as needed for cough.  Dispense: 120 mL; Refill: 0  2. Insomnia Stop ambien Try temazepam Sleep hygiene measures discussed - temazepam (RESTORIL) 15 MG capsule; Take 1 capsule (15 mg total) by mouth at bedtime as needed for sleep.  Dispense: 30 capsule; Refill: 5   Bari Edward, MD [facility]  01/31/2016

## 2016-02-07 ENCOUNTER — Telehealth: Payer: Self-pay

## 2016-02-07 DIAGNOSIS — J40 Bronchitis, not specified as acute or chronic: Secondary | ICD-10-CM

## 2016-02-07 MED ORDER — LEVOFLOXACIN 500 MG PO TABS
500.0000 mg | ORAL_TABLET | Freq: Every day | ORAL | Status: DC
Start: 2016-02-07 — End: 2016-06-26

## 2016-02-07 MED ORDER — BENZONATATE 200 MG PO CAPS: 200.0000 mg | ORAL_CAPSULE | Freq: Two times a day (BID) | ORAL | Status: DC | PRN

## 2016-02-07 NOTE — Telephone Encounter (Signed)
Spoke with pt. Pt. Advised. MAH 

## 2016-02-07 NOTE — Telephone Encounter (Signed)
Patient states that he has been taking the antibiotic as prescribed. Reports that he feels like he is not improving. Patient states that he wanted to know if there was something more we could give him. States that the cough is worse and keeping him from sleeping. Please Advise.

## 2016-02-07 NOTE — Telephone Encounter (Signed)
I sent in a stronger antibiotic and a cough suppressant.  Stop the previous antibiotic.

## 2016-02-10 ENCOUNTER — Other Ambulatory Visit: Payer: Self-pay

## 2016-02-10 DIAGNOSIS — J40 Bronchitis, not specified as acute or chronic: Secondary | ICD-10-CM

## 2016-02-10 MED ORDER — HYDROCODONE-HOMATROPINE 5-1.5 MG/5ML PO SYRP: 5.0000 mL | ORAL_SOLUTION | Freq: Four times a day (QID) | ORAL | Status: DC | PRN

## 2016-02-10 NOTE — Telephone Encounter (Signed)
Patient is still having a cough and would like to pick up a refill of the cough syrup.

## 2016-03-03 ENCOUNTER — Other Ambulatory Visit: Payer: Self-pay | Admitting: Internal Medicine

## 2016-06-27 ENCOUNTER — Ambulatory Visit: Payer: No Typology Code available for payment source | Admitting: Internal Medicine

## 2016-07-02 ENCOUNTER — Other Ambulatory Visit: Payer: No Typology Code available for payment source

## 2016-07-02 ENCOUNTER — Encounter: Payer: Self-pay | Admitting: *Deleted

## 2016-07-02 DIAGNOSIS — I10 Essential (primary) hypertension: Secondary | ICD-10-CM | POA: Diagnosis not present

## 2016-07-02 NOTE — Patient Instructions (Signed)
  Your procedure is scheduled on:07/09/16 Report to Day Surgery. [facility]LO[facility] To find out your arrival time please call [phone] between 1PM - 3PM on 07/06/16.  Remember: Instructions that are not followed completely may result in serious medical risk, up to and including death, or upon the discretion of your surgeon and anesthesiologist your surgery may need to be rescheduled.    __X__ 1. Do not eat food or drink liquids after midnight. No gum chewing or hard candies.     __X__ 2. No Alcohol for 24 hours before or after surgery.   _X___ 3. Do Not Smoke For 24 Hours Prior to Your Surgery.   ____ 4. Bring all medications with you on the day of surgery if instructed.    __X__ 5. Notify your doctor if there is any change in your medical condition     (cold, fever, infections).       Do not wear jewelry, make-up, hairpins, clips or nail polish.  Do not wear lotions, powders, or perfumes. You may wear deodorant.  Do not shave 48 hours prior to surgery. Men may shave face and neck.  Do not bring valuables to the hospital.    [facility] is not responsible for any belongings or valuables.               Contacts, dentures or bridgework may not be worn into surgery.  Leave your suitcase in the car. After surgery it may be brought to your room.  For patients admitted to the hospital, discharge time is determined by your                treatment team.   Patients discharged the day of surgery will not be allowed to drive home.   Please read over the following fact sheets that you were given:   Surgical Site Infection Prevention   __X__ Take these medicines the morning of surgery with A SIP OF WATER:    1. COZAAR  2.   3.   4.  5.  6.  ____ Fleet Enema (as directed)   ____ Use CHG Soap as directed  _X___ Use inhalers on the day of surgery  ____ Stop metformin 2 days prior to surgery    ____ Take 1/2 of usual insulin dose the night before surgery and none on the  morning of surgery.   ____ Stop Coumadin/Plavix/aspirin on   __X__ Stop Anti-inflammatories on         MOBIC ALREADY STOPPED   ____ Stop supplements until after surgery.    ____ Bring C-Pap to the hospital.

## 2016-07-04 ENCOUNTER — Encounter
Admission: RE | Admit: 2016-07-04 | Discharge: 2016-07-04 | Disposition: A | Discharge status: Home / Self Care | Payer: Commercial Managed Care - HMO | Source: Ambulatory Visit | Attending: Otolaryngology | Admitting: Otolaryngology

## 2016-07-04 DIAGNOSIS — Z01810 Encounter for preprocedural cardiovascular examination: Secondary | ICD-10-CM | POA: Diagnosis not present

## 2016-07-09 ENCOUNTER — Ambulatory Visit
Admission: RE | Admit: 2016-07-09 | Discharge: 2016-07-09 | Disposition: A | Discharge status: Home / Self Care | Payer: Commercial Managed Care - HMO | Source: Ambulatory Visit | Attending: Otolaryngology | Admitting: Otolaryngology

## 2016-07-09 ENCOUNTER — Encounter
Admission: RE | Disposition: A | Discharge status: Home / Self Care | Payer: Self-pay | Source: Ambulatory Visit | Attending: Otolaryngology

## 2016-07-09 ENCOUNTER — Ambulatory Visit: Payer: Commercial Managed Care - HMO | Admitting: Anesthesiology

## 2016-07-09 ENCOUNTER — Encounter: Payer: Self-pay | Admitting: *Deleted

## 2016-07-09 DIAGNOSIS — J342 Deviated nasal septum: Secondary | ICD-10-CM | POA: Insufficient documentation

## 2016-07-09 DIAGNOSIS — F329 Major depressive disorder, single episode, unspecified: Secondary | ICD-10-CM | POA: Diagnosis not present

## 2016-07-09 DIAGNOSIS — J343 Hypertrophy of nasal turbinates: Secondary | ICD-10-CM | POA: Insufficient documentation

## 2016-07-09 DIAGNOSIS — Z9889 Other specified postprocedural states: Secondary | ICD-10-CM | POA: Insufficient documentation

## 2016-07-09 DIAGNOSIS — J320 Chronic maxillary sinusitis: Secondary | ICD-10-CM | POA: Insufficient documentation

## 2016-07-09 DIAGNOSIS — M1990 Unspecified osteoarthritis, unspecified site: Secondary | ICD-10-CM | POA: Insufficient documentation

## 2016-07-09 DIAGNOSIS — Z79899 Other long term (current) drug therapy: Secondary | ICD-10-CM | POA: Diagnosis not present

## 2016-07-09 DIAGNOSIS — G4733 Obstructive sleep apnea (adult) (pediatric): Secondary | ICD-10-CM | POA: Insufficient documentation

## 2016-07-09 DIAGNOSIS — J322 Chronic ethmoidal sinusitis: Secondary | ICD-10-CM | POA: Insufficient documentation

## 2016-07-09 DIAGNOSIS — I10 Essential (primary) hypertension: Secondary | ICD-10-CM | POA: Insufficient documentation

## 2016-07-09 DIAGNOSIS — Z791 Long term (current) use of non-steroidal anti-inflammatories (NSAID): Secondary | ICD-10-CM | POA: Insufficient documentation

## 2016-07-09 DIAGNOSIS — J321 Chronic frontal sinusitis: Secondary | ICD-10-CM | POA: Insufficient documentation

## 2016-07-09 DIAGNOSIS — Z7951 Long term (current) use of inhaled steroids: Secondary | ICD-10-CM | POA: Insufficient documentation

## 2016-07-09 DIAGNOSIS — Z9049 Acquired absence of other specified parts of digestive tract: Secondary | ICD-10-CM | POA: Diagnosis not present

## 2016-07-09 DIAGNOSIS — Z87891 Personal history of nicotine dependence: Secondary | ICD-10-CM | POA: Diagnosis not present

## 2016-07-09 DIAGNOSIS — J329 Chronic sinusitis, unspecified: Secondary | ICD-10-CM | POA: Diagnosis present

## 2016-07-09 HISTORY — PX: FRONTAL SINUS EXPLORATION: SHX6591

## 2016-07-09 HISTORY — DX: Sleep apnea, unspecified: G47.30

## 2016-07-09 HISTORY — PX: IMAGE GUIDED SINUS SURGERY: SHX6570

## 2016-07-09 HISTORY — PX: TURBINATE REDUCTION: SHX6157

## 2016-07-09 HISTORY — PX: SEPTOPLASTY WITH ETHMOIDECTOMY, AND MAXILLARY ANTROSTOMY: SHX6090

## 2016-07-09 HISTORY — DX: Essential (primary) hypertension: I10

## 2016-07-09 SURGERY — SINUS SURGERY, WITH IMAGING GUIDANCE
Anesthesia: General | Laterality: Bilateral

## 2016-07-09 MED ORDER — FENTANYL CITRATE (PF) 100 MCG/2ML IJ SOLN: 25.0000 ug | INTRAMUSCULAR | Status: DC | PRN

## 2016-07-09 MED ORDER — PROPOFOL 10 MG/ML IV BOLUS
INTRAVENOUS | Status: DC | PRN
  Administered 2016-07-09: 180 mg via INTRAVENOUS

## 2016-07-09 MED ORDER — MIDAZOLAM HCL 2 MG/2ML IJ SOLN
INTRAMUSCULAR | Status: DC | PRN
  Administered 2016-07-09: 2 mg via INTRAVENOUS

## 2016-07-09 MED ORDER — LIDOCAINE-EPINEPHRINE (PF) 1 %-1:200000 IJ SOLN
INTRAMUSCULAR | Status: DC | PRN
  Administered 2016-07-09: 6 mL via INTRADERMAL

## 2016-07-09 MED ORDER — ONDANSETRON HCL 4 MG/2ML IJ SOLN
INTRAMUSCULAR | Status: DC | PRN
  Administered 2016-07-09: 4 mg via INTRAVENOUS

## 2016-07-09 MED ORDER — LACTATED RINGERS IV SOLN
INTRAVENOUS | Status: DC
  Administered 2016-07-09 (×2): via INTRAVENOUS

## 2016-07-09 MED ORDER — FAMOTIDINE 20 MG PO TABS
20.0000 mg | ORAL_TABLET | Freq: Once | ORAL | Status: AC
  Administered 2016-07-09: 20 mg via ORAL

## 2016-07-09 MED ORDER — EPHEDRINE SULFATE 50 MG/ML IJ SOLN
INTRAMUSCULAR | Status: DC | PRN
  Administered 2016-07-09 (×3): 5 mg via INTRAVENOUS

## 2016-07-09 MED ORDER — CEFAZOLIN SODIUM-DEXTROSE 2-3 GM-% IV SOLR
INTRAVENOUS | Status: DC | PRN
  Administered 2016-07-09: 2 g via INTRAVENOUS

## 2016-07-09 MED ORDER — ONDANSETRON HCL 4 MG/2ML IJ SOLN: 4.0000 mg | Freq: Once | INTRAMUSCULAR | Status: DC | PRN

## 2016-07-09 MED ORDER — PHENYLEPHRINE HCL 10 % OP SOLN
Freq: Once | OPHTHALMIC | Status: AC
  Administered 2016-07-09: 10 mL via NASAL
  Filled 2016-07-09: qty 10

## 2016-07-09 MED ORDER — DEXAMETHASONE SODIUM PHOSPHATE 10 MG/ML IJ SOLN
INTRAMUSCULAR | Status: DC | PRN
  Administered 2016-07-09: 10 mg via INTRAVENOUS

## 2016-07-09 MED ORDER — SUCCINYLCHOLINE CHLORIDE 20 MG/ML IJ SOLN
INTRAMUSCULAR | Status: DC | PRN
  Administered 2016-07-09: 100 mg via INTRAVENOUS

## 2016-07-09 MED ORDER — ROCURONIUM 10MG/ML (10ML) SYRINGE FOR MEDFUSION PUMP - OPTIME
INTRAVENOUS | Status: DC | PRN
  Administered 2016-07-09: 50 mg via INTRAVENOUS

## 2016-07-09 MED ORDER — FENTANYL CITRATE (PF) 100 MCG/2ML IJ SOLN
INTRAMUSCULAR | Status: DC | PRN
  Administered 2016-07-09: 100 ug via INTRAVENOUS
  Administered 2016-07-09: 50 ug via INTRAVENOUS

## 2016-07-09 MED ORDER — LIDOCAINE HCL (CARDIAC) 20 MG/ML IV SOLN
INTRAVENOUS | Status: DC | PRN
  Administered 2016-07-09: 100 mg via INTRAVENOUS

## 2016-07-09 SURGICAL SUPPLY — 40 items
BATTERY INSTRU NAVIGATION (MISCELLANEOUS) ×9 IMPLANT
BLADE SURG 15 STRL LF DISP TIS (BLADE) ×1 IMPLANT
BLADE SURG 15 STRL SS (BLADE) ×2
CANISTER SUCT 1200ML W/VALVE (MISCELLANEOUS) ×3 IMPLANT
CANISTER SUCT 3000ML (MISCELLANEOUS) ×3 IMPLANT
COAG SUCT 10F 3.5MM HAND CTRL (MISCELLANEOUS) ×3 IMPLANT
CUP MEDICINE 2OZ PLAST GRAD ST (MISCELLANEOUS) ×3 IMPLANT
DRESSING NASL FOAM PST OP SINU (MISCELLANEOUS) IMPLANT
DRSG NASAL 4CM NASOPORE (MISCELLANEOUS) IMPLANT
DRSG NASAL FOAM POST OP SINU (MISCELLANEOUS)
ELECT REM PT RETURN 9FT ADLT (ELECTROSURGICAL) ×3
ELECTRODE REM PT RTRN 9FT ADLT (ELECTROSURGICAL) ×1 IMPLANT
GLOVE PROTEXIS LATEX SZ 7.5 (GLOVE) ×6 IMPLANT
GOWN STRL REUS W/ TWL LRG LVL3 (GOWN DISPOSABLE) ×2 IMPLANT
GOWN STRL REUS W/TWL LRG LVL3 (GOWN DISPOSABLE) ×4
IRRIGATOR 4MM STR (IRRIGATION / IRRIGATOR) ×3 IMPLANT
IV NS 500ML (IV SOLUTION) ×2
IV NS 500ML BAXH (IV SOLUTION) ×1 IMPLANT
LABEL OR SOLS (LABEL) IMPLANT
NAVIGATION MASK REG  ST (MISCELLANEOUS) ×3 IMPLANT
NEEDLE SPNL 25GX3.5 QUINCKE BL (NEEDLE) ×3 IMPLANT
NS IRRIG 500ML POUR BTL (IV SOLUTION) ×3 IMPLANT
PACK HEAD/NECK (MISCELLANEOUS) ×3 IMPLANT
PACKING NASAL EPIS 4X2.4 XEROG (MISCELLANEOUS) ×6 IMPLANT
PATTIES SURGICAL .5 X3 (DISPOSABLE) ×3 IMPLANT
SET HANDPIECE IRR DIEGO (MISCELLANEOUS) ×3 IMPLANT
SOL ANTI-FOG 6CC FOG-OUT (MISCELLANEOUS) ×1 IMPLANT
SOL FOG-OUT ANTI-FOG 6CC (MISCELLANEOUS) ×2
SPLINT NASAL REUTER (MISCELLANEOUS) ×3 IMPLANT
SPOGE SURGIFLO 8M (HEMOSTASIS) ×2
SPONGE SURGIFLO 8M (HEMOSTASIS) ×1 IMPLANT
SUT CHROMIC 3-0 (SUTURE) ×2
SUT CHROMIC 3-0 KS 27XMFL CR (SUTURE) ×1
SUT ETHILON 3-0 KS 30 BLK (SUTURE) ×3 IMPLANT
SUT PLAIN GUT 4-0 (SUTURE) IMPLANT
SUTURE CHRMC 3-0 KS 27XMFL CR (SUTURE) ×1 IMPLANT
SWAB CULTURE AMIES ANAERIB BLU (MISCELLANEOUS) IMPLANT
SYR 20CC LL (SYRINGE) ×3 IMPLANT
SYR 3ML LL SCALE MARK (SYRINGE) ×3 IMPLANT
WATER STERILE IRR 1000ML POUR (IV SOLUTION) IMPLANT

## 2016-07-09 NOTE — Anesthesia Postprocedure Evaluation (Signed)
Anesthesia Post Note  Patient: Alexander LeavenRonald K Lohse  Procedure(s) Performed: Procedure(s) (LRB): IMAGE GUIDED SINUS SURGERY  (Bilateral) SEPTOPLASTY WITH TOTAL ETHMOIDECTOMY, AND MAXILLARY ANTROSTOMY (Bilateral) TURBINATE REDUCTION (Bilateral) FRONTAL SINUSOTOMY (Bilateral)  Patient location during evaluation: PACU Anesthesia Type: General Level of consciousness: awake and alert Pain management: pain level controlled Vital Signs Assessment: post-procedure vital signs reviewed and stable Respiratory status: spontaneous breathing, nonlabored ventilation, respiratory function stable and patient connected to nasal cannula oxygen Cardiovascular status: blood pressure returned to baseline and stable Postop Assessment: no signs of nausea or vomiting Anesthetic complications: no    Last Vitals:  Filed Vitals:   07/09/16 1341 07/09/16 1352  BP: 145/94 154/89  Pulse: 84 73  Temp: 36.5 C 36.7 C  Resp: 15 16    Last Pain:  Filed Vitals:   07/09/16 1353  PainSc: 0-No pain                 Yevette EdwardsJames G Adams

## 2016-07-09 NOTE — Discharge Instructions (Signed)

## 2016-07-09 NOTE — H&P (Signed)
  H&P has been reviewed and no changes necessary. To be downloaded later. 

## 2016-07-09 NOTE — Anesthesia Preprocedure Evaluation (Signed)
Anesthesia Evaluation  Patient identified by MRN, date of birth, ID band Patient awake    Reviewed: Allergy & Precautions, H&P , NPO status , Patient's Chart, lab work & pertinent test results, reviewed documented beta blocker date and time   Airway Mallampati: II   Neck ROM: full    Dental  (+) Poor Dentition   Pulmonary neg pulmonary ROS,    Pulmonary exam normal        Cardiovascular hypertension, negative cardio ROS Normal cardiovascular exam     Neuro/Psych negative neurological ROS  negative psych ROS   GI/Hepatic negative GI ROS, Neg liver ROS,   Endo/Other  negative endocrine ROS  Renal/GU negative Renal ROS  negative genitourinary   Musculoskeletal   Abdominal   Peds  Hematology negative hematology ROS (+)   Anesthesia Other Findings Past Medical History:   Hyperlipidemia                                               Hypertension                                                 Sleep apnea                                                    Comment:NO CPAP Past Surgical History:   CHOLECYSTECTOMY                                               HERNIA REPAIR                                   Right 2012           Comment:inguinal BMI    Body Mass Index   29.83 kg/m 2     Reproductive/Obstetrics                             Anesthesia Physical Anesthesia Plan  ASA: III  Anesthesia Plan: General   Post-op Pain Management:    Induction:   Airway Management Planned:   Additional Equipment:   Intra-op Plan:   Post-operative Plan:   Informed Consent: I have reviewed the patients History and Physical, chart, labs and discussed the procedure including the risks, benefits and alternatives for the proposed anesthesia with the patient or authorized representative who has indicated his/her understanding and acceptance.   Dental Advisory Given  Plan Discussed with:  CRNA  Anesthesia Plan Comments:         Anesthesia Quick Evaluation

## 2016-07-09 NOTE — Op Note (Signed)
07/09/2016  1:04 PM   [id number]   Pre-Op Dx:  Deviated Nasal Septum, Hypertrophic Inferior Turbinates, bilateral chronic maxillary, frontal, and ethmoid sinusitis  Post-op Dx: Same  Proc: Nasal Septoplasty, Bilateral Partial Reduction Inferior Turbinates, bilateral endoscopic frontal sinusotomy, bilateral endoscopic total ethmoidectomy, bilateral endoscopic maxillary antrostomies, use of image guided system  Surg:  Judianne Seiple H  Anes:  GOT  EBL:  100 mL  Comp:  None  Findings: Very thickened mucous membranes throughout the left ethmoid and frontal sinus openings. Septum deviated to the left side especially inferiorly. The right inferior turbinate was larger than the left  Procedure: With the patient in a comfortable supine position,  general orotracheal anesthesia was induced without difficulty.     The patient received preoperative Afrin spray for topical decongestion and vasoconstriction.  Intravenous prophylactic antibiotics were administered.  At an appropriate level, the patient was placed in a semi-sitting position.  Nasal vibrissae were trimmed.   1% Xylocaine with 1:100,000 epinephrine, 10 cc's, was infiltrated into the anterior floor of the nose, into the nasal spine region, into the membranous columella, and finally into the submucoperichondrial plane of the septum on both sides.  Several minutes were allowed for this to take effect.  Cottoniod pledgetts soaked in Afrin and 4% Xylocaine were placed into both nasal cavities and left while the patient was prepped and draped in the standard fashion. The image guided system was brought in and the CT scan was downloaded from the disc. The template was applied to the face and the template was registered to the system. There is 0.7 mm of variance. The suction instruments were then registered and there was perfect alignment between the suction instruments and the image guided system.  The materials were removed from the nose and  observed to be intact and correct in number.  The nose was inspected with a headlight and a 0 scope with the findings as described above.  A left Killian incision was sharply executed and carried down to the caudal edge of the quadrangular cartilage. The mucoperichondrium was elelvated along the quadrangular plate back to the bony-cartilaginous junction. The mucoperiostium was then elevated along the ethmoid plate and the vomer. The boney-catilaginous junction was then split with a freer elevator and the mucoperiosteum was elevated on the opposite side. The mucoperiosteum was then elevated along the maxillary crest as needed to expose the crooked bone of the crest.  Boney spurs of the vomer and maxillary crest were removed with Lenoria Chimeakahashi forceps.  The cartilaginous plate was trimmed along its posterior and inferior borders of about 2 mm of cartilage to free it up inferiorly. Some of the deviated ethmoid plate was then fractured and removed with Takahashi forceps to free up the posterior border of the quadrangular plate and allow it to swing back to the midline. The mucosal flaps were placed back into their anatomic position to allow visualization of the airways. The septum now sat in the midline with an improved airway. There were tears the mucosa posteriorly where there is a large vomer spur to the left side. The mucosal flaps are then sutured together using a through and through whip stitch of 4-0 Plain Gut with a mini-Keith needle. This was used to close the [place] incision as well.   The inferior turbinates were then inspected. An incision was created along the inferior aspect of the right inferior turbinate with removal of some of the inferior soft tissue and bone. Electrocautery was used to control bleeding in the area.  The remaining turbinate was then outfractured to open up the airway further. There was no significant bleeding noted. The left turbinate was then outfractured in a similar  fashion.  The 0 scope was used to visualize the left airway and the left middle turbinate was infractured. Bullosa in its upper portion part of the lateral wall the conchal bullosa of the middle turbinate was removed. The side biter was used to incise the uncinate process and this was removed with through biting forceps and the [facility] microdebrider. The natural maxillary antrum was widened inferiorly and posteriorly to create a bigger opening. This was done with through biting forceps and the [facility] microdebrider. The posterior middle ethmoid air cells were opened using the image guided system to verify the depth of dissection. All posterior middle ethmoid air cells were opened. The mucous membranes were fairly thickened especially as and moved anteriorly. The 30 scope was used to visualize the anterior ethmoid air cells and these were trimmed and opened. The frontal sinus image guided suction was used to find the opening to the frontal sinus duct and widen this using the [facility] through biting frontal sinus instruments. Once the frontal sinus duct was widely opened and I could pass right into the frontal sinus duct care was taken to clear all the rough edges. The 70 Degrees scope was used to visualize this area and make sure looked open and clear. A cottonoid pledget was placed here for vasoconstriction while the other side was addressed.  The 0 scope was used to visualize the right airway and the middle turbinate was infractured. The uncinate process was removed again with the side biter, 45 through biters and the [facility] microdebrider. The maxillary antrum was opened using through biting forceps and the microdebrider. The 30 scope was used to make sure that this was clear and the natural ostium had been widened. The 0 scope was used again for the posterior middle ethmoid air cells. These were opened widely using the image guided system to evaluate the depth of dissection. The 30 scope was used to visualize  the middle ethmoid air cells and again these were opened widely to provide good drainage. The 30 and 70 scopes were used for opening up the frontal sinus duct make sure that get into this easily and a could drain well. Mucous membranes were not as inflamed on the right side as they were on the left. A cottonoid pledget was placed here again while the opposite side was revisualized.  The left side was visualized the 0 and 30 scopes and all the sinuses were open and clear and there is minimal oozing. Xerogel was placed into the anterior ethmoid air cells near the frontal sinus duct. A second piece was then placed the posterior ethmoid and this was used to hold the middle turbinate medialized. This was done on the right side again with cleaning everything out make sure that all the air cells were clear. Xerogel was placed into the anterior ethmoid and then a second piece the posterior ethmoid.  The airways were then visualized and showed open passageways on both sides that were significantly improved compared to before surgery. There was no signifcant bleeding. Nasal splints were applied to both sides of the septum using Xomed 0.40mm regular sized splints that were trimmed, and then held in position with a 3-0 Nylon through and through suture.  The patient was turned back over to anesthesia, and awakened, extubated, and taken to the PACU in satisfactory condition.  Dispo:  PACU to home  Plan: Ice, elevation, narcotic analgesia, steroid taper, and prophylactic antibiotics for the duration of indwelling nasal foreign bodies.  We will reevaluate the patient in the office in 7 days and remove the septal splints.  Return to work in 10 days, strenuous activities in two weeks.   Gwyn Hieronymus H 07/09/2016 1:04 PM

## 2016-07-09 NOTE — Anesthesia Procedure Notes (Signed)
Procedure Name: Intubation Date/Time: 07/09/2016 11:05 AM Performed by: Jannet MantisPACE, Zephaniah Enyeart Pre-anesthesia Checklist: Patient identified, Emergency Drugs available, Suction available and Patient being monitored Patient Re-evaluated:Patient Re-evaluated prior to inductionOxygen Delivery Method: Circle system utilized Preoxygenation: Pre-oxygenation with 100% oxygen Intubation Type: IV induction Ventilation: Oral airway inserted - appropriate to patient size and Mask ventilation with difficulty Laryngoscope Size: Mac and 3 Grade View: Grade I Tube type: Oral Tube size: 7.0 mm Number of attempts: 1 Airway Equipment and Method: Oral airway Placement Confirmation: ETT inserted through vocal cords under direct vision,  positive ETCO2 and breath sounds checked- equal and bilateral Secured at: 22 cm Tube secured with: Tape Dental Injury: Teeth and Oropharynx as per pre-operative assessment  Comments: Oral RAE

## 2016-07-09 NOTE — Transfer of Care (Signed)
Immediate Anesthesia Transfer of Care Note  Patient: Alexander LeavenRonald K Ottley  Procedure(s) Performed: Procedure(s): IMAGE GUIDED SINUS SURGERY  (Bilateral) SEPTOPLASTY WITH TOTAL ETHMOIDECTOMY, AND MAXILLARY ANTROSTOMY (Bilateral) TURBINATE REDUCTION (Bilateral) FRONTAL SINUSOTOMY (Bilateral)  Patient Location: PACU  Anesthesia Type:General  Level of Consciousness: awake  Airway & Oxygen Therapy: Patient Spontanous Breathing  Post-op Assessment: Report given to RN  Post vital signs: stable  Last Vitals:  Filed Vitals:   07/09/16 0915 07/09/16 1312  BP: 148/81 141/84  Pulse: 66 79  Temp: 36.7 C 36.5 C  Resp: 16 20    Last Pain: There were no vitals filed for this visit.       Complications: No apparent anesthesia complications

## 2016-07-10 ENCOUNTER — Encounter: Payer: Self-pay | Admitting: Otolaryngology

## 2016-07-10 LAB — SURGICAL PATHOLOGY

## 2017-12-19 ENCOUNTER — Other Ambulatory Visit: Payer: Self-pay | Admitting: Sports Medicine

## 2017-12-19 DIAGNOSIS — G8929 Other chronic pain: Secondary | ICD-10-CM

## 2017-12-19 DIAGNOSIS — M7551 Bursitis of right shoulder: Secondary | ICD-10-CM

## 2017-12-19 DIAGNOSIS — M25511 Pain in right shoulder: Secondary | ICD-10-CM

## 2017-12-20 ENCOUNTER — Other Ambulatory Visit: Payer: Self-pay | Admitting: Sports Medicine

## 2017-12-20 DIAGNOSIS — M7551 Bursitis of right shoulder: Secondary | ICD-10-CM

## 2017-12-26 ENCOUNTER — Ambulatory Visit
Admission: RE | Admit: 2017-12-26 | Discharge: 2017-12-26 | Disposition: A | Discharge status: Home / Self Care | Payer: BLUE CROSS/BLUE SHIELD | Source: Ambulatory Visit | Attending: Sports Medicine | Admitting: Sports Medicine

## 2017-12-26 ENCOUNTER — Other Ambulatory Visit: Payer: Self-pay | Admitting: Sports Medicine

## 2017-12-26 DIAGNOSIS — Z1389 Encounter for screening for other disorder: Secondary | ICD-10-CM

## 2017-12-26 DIAGNOSIS — Z0189 Encounter for other specified special examinations: Secondary | ICD-10-CM

## 2017-12-26 DIAGNOSIS — M7551 Bursitis of right shoulder: Secondary | ICD-10-CM

## 2018-01-23 ENCOUNTER — Encounter: Payer: Self-pay | Admitting: *Deleted

## 2018-01-23 ENCOUNTER — Other Ambulatory Visit: Payer: Self-pay

## 2018-01-30 ENCOUNTER — Ambulatory Visit: Payer: BLUE CROSS/BLUE SHIELD | Attending: Orthopedic Surgery

## 2018-01-30 ENCOUNTER — Ambulatory Visit: Payer: BLUE CROSS/BLUE SHIELD | Admitting: Anesthesiology

## 2018-01-30 ENCOUNTER — Encounter
Admission: RE | Disposition: A | Discharge status: Home / Self Care | Payer: Self-pay | Source: Ambulatory Visit | Attending: Orthopedic Surgery

## 2018-01-30 ENCOUNTER — Ambulatory Visit
Admission: RE | Admit: 2018-01-30 | Discharge: 2018-01-30 | Disposition: A | Discharge status: Home / Self Care | Payer: BLUE CROSS/BLUE SHIELD | Source: Ambulatory Visit | Attending: Orthopedic Surgery | Admitting: Orthopedic Surgery

## 2018-01-30 DIAGNOSIS — Z79899 Other long term (current) drug therapy: Secondary | ICD-10-CM | POA: Diagnosis not present

## 2018-01-30 DIAGNOSIS — G4733 Obstructive sleep apnea (adult) (pediatric): Secondary | ICD-10-CM | POA: Insufficient documentation

## 2018-01-30 DIAGNOSIS — M75111 Incomplete rotator cuff tear or rupture of right shoulder, not specified as traumatic: Secondary | ICD-10-CM | POA: Insufficient documentation

## 2018-01-30 DIAGNOSIS — F324 Major depressive disorder, single episode, in partial remission: Secondary | ICD-10-CM | POA: Insufficient documentation

## 2018-01-30 DIAGNOSIS — G4700 Insomnia, unspecified: Secondary | ICD-10-CM | POA: Insufficient documentation

## 2018-01-30 DIAGNOSIS — M7541 Impingement syndrome of right shoulder: Secondary | ICD-10-CM | POA: Insufficient documentation

## 2018-01-30 DIAGNOSIS — M170 Bilateral primary osteoarthritis of knee: Secondary | ICD-10-CM | POA: Insufficient documentation

## 2018-01-30 DIAGNOSIS — N400 Enlarged prostate without lower urinary tract symptoms: Secondary | ICD-10-CM | POA: Insufficient documentation

## 2018-01-30 DIAGNOSIS — Z87891 Personal history of nicotine dependence: Secondary | ICD-10-CM | POA: Insufficient documentation

## 2018-01-30 DIAGNOSIS — W19XXXA Unspecified fall, initial encounter: Secondary | ICD-10-CM | POA: Diagnosis not present

## 2018-01-30 DIAGNOSIS — M19011 Primary osteoarthritis, right shoulder: Secondary | ICD-10-CM | POA: Insufficient documentation

## 2018-01-30 DIAGNOSIS — M7521 Bicipital tendinitis, right shoulder: Secondary | ICD-10-CM | POA: Insufficient documentation

## 2018-01-30 DIAGNOSIS — Z9989 Dependence on other enabling machines and devices: Secondary | ICD-10-CM | POA: Diagnosis not present

## 2018-01-30 DIAGNOSIS — I10 Essential (primary) hypertension: Secondary | ICD-10-CM | POA: Insufficient documentation

## 2018-01-30 DIAGNOSIS — M25511 Pain in right shoulder: Secondary | ICD-10-CM | POA: Diagnosis present

## 2018-01-30 DIAGNOSIS — M75100 Unspecified rotator cuff tear or rupture of unspecified shoulder, not specified as traumatic: Secondary | ICD-10-CM | POA: Diagnosis present

## 2018-01-30 HISTORY — PX: SHOULDER ARTHROSCOPY WITH OPEN ROTATOR CUFF REPAIR: SHX6092

## 2018-01-30 SURGERY — ARTHROSCOPY, SHOULDER WITH REPAIR, ROTATOR CUFF, OPEN
Anesthesia: Regional | Site: Shoulder | Laterality: Right | Wound class: Clean

## 2018-01-30 MED ORDER — DEXMEDETOMIDINE HCL 200 MCG/2ML IV SOLN
INTRAVENOUS | Status: DC | PRN
  Administered 2018-01-30: 8 ug via INTRAVENOUS
  Administered 2018-01-30: 4 ug via INTRAVENOUS
  Administered 2018-01-30: 8 ug via INTRAVENOUS

## 2018-01-30 MED ORDER — ONDANSETRON HCL 4 MG/2ML IJ SOLN
INTRAMUSCULAR | Status: DC | PRN
  Administered 2018-01-30 (×2): 4 mg via INTRAVENOUS

## 2018-01-30 MED ORDER — ONDANSETRON 4 MG PO TBDP: 4.0000 mg | ORAL_TABLET | Freq: Three times a day (TID) | ORAL | 0 refills | Status: AC | PRN

## 2018-01-30 MED ORDER — LIDOCAINE-EPINEPHRINE 1 %-1:100000 IJ SOLN
INTRAMUSCULAR | Status: DC | PRN
  Administered 2018-01-30: 5 mL

## 2018-01-30 MED ORDER — FENTANYL CITRATE (PF) 100 MCG/2ML IJ SOLN: 25.0000 ug | INTRAMUSCULAR | Status: DC | PRN

## 2018-01-30 MED ORDER — FENTANYL CITRATE (PF) 100 MCG/2ML IJ SOLN
INTRAMUSCULAR | Status: DC | PRN
  Administered 2018-01-30: 50 ug via INTRAVENOUS

## 2018-01-30 MED ORDER — LACTATED RINGERS IV SOLN: INTRAVENOUS | Status: DC

## 2018-01-30 MED ORDER — OXYCODONE HCL 5 MG/5ML PO SOLN: 5.0000 mg | Freq: Once | ORAL | Status: DC | PRN

## 2018-01-30 MED ORDER — ACETAMINOPHEN 500 MG PO TABS: 1000.0000 mg | ORAL_TABLET | Freq: Three times a day (TID) | ORAL | 2 refills | Status: AC

## 2018-01-30 MED ORDER — OXYCODONE HCL 5 MG PO TABS: 5.0000 mg | ORAL_TABLET | ORAL | 0 refills | Status: AC | PRN

## 2018-01-30 MED ORDER — CEFAZOLIN SODIUM 1 G IJ SOLR
2000.0000 mg | Freq: Once | INTRAMUSCULAR | Status: AC
  Administered 2018-01-30: 2000 mg via INTRAVENOUS

## 2018-01-30 MED ORDER — CEFAZOLIN SODIUM-DEXTROSE 2-3 GM-%(50ML) IV SOLR
INTRAVENOUS | Status: DC | PRN
  Administered 2018-01-30: 2 g via INTRAVENOUS

## 2018-01-30 MED ORDER — LACTATED RINGERS IV SOLN
10.0000 mL/h | INTRAVENOUS | Status: DC
  Administered 2018-01-30 (×2): via INTRAVENOUS

## 2018-01-30 MED ORDER — PROPOFOL 10 MG/ML IV BOLUS
INTRAVENOUS | Status: DC | PRN
  Administered 2018-01-30: 200 mg via INTRAVENOUS
  Administered 2018-01-30: 100 mg via INTRAVENOUS

## 2018-01-30 MED ORDER — ONDANSETRON HCL 4 MG/2ML IJ SOLN: 4.0000 mg | Freq: Once | INTRAMUSCULAR | Status: DC | PRN

## 2018-01-30 MED ORDER — OXYCODONE HCL 5 MG PO TABS: 5.0000 mg | ORAL_TABLET | Freq: Once | ORAL | Status: DC | PRN

## 2018-01-30 MED ORDER — LACTATED RINGERS IV SOLN
INTRAVENOUS | Status: DC | PRN
  Administered 2018-01-30: 13 mL

## 2018-01-30 MED ORDER — ACETAMINOPHEN 10 MG/ML IV SOLN: 1000.0000 mg | Freq: Once | INTRAVENOUS | Status: DC | PRN

## 2018-01-30 MED ORDER — ROPIVACAINE HCL 5 MG/ML IJ SOLN
INTRAMUSCULAR | Status: DC | PRN
  Administered 2018-01-30: 40 mL

## 2018-01-30 MED ORDER — LIDOCAINE HCL (CARDIAC) 20 MG/ML IV SOLN
INTRAVENOUS | Status: DC | PRN
  Administered 2018-01-30: 80 mg via INTRAVENOUS

## 2018-01-30 MED ORDER — MIDAZOLAM HCL 2 MG/2ML IJ SOLN
INTRAMUSCULAR | Status: DC | PRN
  Administered 2018-01-30: 2 mg via INTRAVENOUS

## 2018-01-30 MED ORDER — DEXAMETHASONE SODIUM PHOSPHATE 4 MG/ML IJ SOLN
INTRAMUSCULAR | Status: DC | PRN
  Administered 2018-01-30: 4 mg via INTRAVENOUS

## 2018-01-30 MED ORDER — SUCCINYLCHOLINE CHLORIDE 20 MG/ML IJ SOLN
INTRAMUSCULAR | Status: DC | PRN
  Administered 2018-01-30: 100 mg via INTRAVENOUS

## 2018-01-30 SURGICAL SUPPLY — 75 items
ADAPTER IRRIG TUBE 2 SPIKE SOL (ADAPTER) ×6 IMPLANT
ANCHOR SUT FBRTK SUTURETAP 1.3 (Anchor) ×3 IMPLANT
ANCHOR TENDON REGENETEN (Staple) ×3 IMPLANT
ANCHORS BONE REGENETEN (Anchor) ×3 IMPLANT
BIT DRILL RIGD1.8MM FBRTK STRL (DRILL) ×1 IMPLANT
BRUSH SCRUB EZ  4% CHG (MISCELLANEOUS) ×2
BRUSH SCRUB EZ 4% CHG (MISCELLANEOUS) ×1 IMPLANT
BUR BR 5.5 12 FLUTE (BURR) ×3 IMPLANT
BUR RADIUS 4.0X18.5 (BURR) ×3 IMPLANT
BUR RADIUS 5.5 (BURR) ×3 IMPLANT
CHLORAPREP W/TINT 26ML (MISCELLANEOUS) ×3 IMPLANT
CLOSURE WOUND 1/2 X4 (GAUZE/BANDAGES/DRESSINGS) ×1
COOLER POLAR GLACIER W/PUMP (MISCELLANEOUS) ×6 IMPLANT
COVER LIGHT HANDLE UNIVERSAL (MISCELLANEOUS) ×6 IMPLANT
DERMABOND ADVANCED (GAUZE/BANDAGES/DRESSINGS) ×2
DERMABOND ADVANCED .7 DNX12 (GAUZE/BANDAGES/DRESSINGS) ×1 IMPLANT
DRAPE IMP U-DRAPE 54X76 (DRAPES) ×6 IMPLANT
DRAPE INCISE IOBAN 66X45 STRL (DRAPES) ×3 IMPLANT
DRAPE SHEET LG 3/4 BI-LAMINATE (DRAPES) ×3 IMPLANT
DRILL RIGID 1.8MM FBRTK STRL (DRILL) ×3
DRSG TEGADERM 4X4.75 (GAUZE/BANDAGES/DRESSINGS) ×15 IMPLANT
ELECT REM PT RETURN 9FT ADLT (ELECTROSURGICAL)
ELECTRODE REM PT RTRN 9FT ADLT (ELECTROSURGICAL) IMPLANT
GAUZE PETRO XEROFOAM 1X8 (MISCELLANEOUS) ×3 IMPLANT
GAUZE SPONGE 4X4 12PLY STRL (GAUZE/BANDAGES/DRESSINGS) ×3 IMPLANT
GLOVE BIOGEL PI IND STRL 8 (GLOVE) ×1 IMPLANT
GLOVE BIOGEL PI INDICATOR 8 (GLOVE) ×2
GOWN STRL REIN 2XL XLG LVL4 (GOWN DISPOSABLE) ×3 IMPLANT
GOWN STRL REUS W/ TWL LRG LVL3 (GOWN DISPOSABLE) ×1 IMPLANT
GOWN STRL REUS W/TWL LRG LVL3 (GOWN DISPOSABLE) ×2
GOWN STRL REUS W/TWL LRG LVL4 (GOWN DISPOSABLE) ×3 IMPLANT
IMPLANT REGENETEN MEDIUM (Shoulder) ×3 IMPLANT
IV LACTATED RINGER IRRG 3000ML (IV SOLUTION) ×44
IV LR IRRIG 3000ML ARTHROMATIC (IV SOLUTION) ×22 IMPLANT
KIT RM TURNOVER STRD PROC AR (KITS) ×3 IMPLANT
KIT SPEAR STR 1.6MM DRILL (MISCELLANEOUS) ×3 IMPLANT
KIT SUTURETAK 3.0 INSERT PERC (KITS) ×3 IMPLANT
MAT BLUE FLOOR 46X72 FLO (MISCELLANEOUS) ×6 IMPLANT
NDL SAFETY ECLIPSE 18X1.5 (NEEDLE) ×1 IMPLANT
NEEDLE HYPO 18GX1.5 SHARP (NEEDLE) ×2
NEEDLE HYPO 22GX1.5 SAFETY (NEEDLE) ×3 IMPLANT
NEEDLE MAYO CATGUT SZ 1.5 (NEEDLE) ×3
NEEDLE MAYO CATGUT SZ 2 (NEEDLE) ×1 IMPLANT
PACK ARTHROSCOPY SHOULDER (MISCELLANEOUS) ×3 IMPLANT
PAD ABD DERMACEA PRESS 5X9 (GAUZE/BANDAGES/DRESSINGS) ×3 IMPLANT
PAD WRAPON POLAR SHDR UNIV (MISCELLANEOUS) ×1 IMPLANT
PENCIL SMOKE EVAC W/HOLSTER (ELECTROSURGICAL) ×3 IMPLANT
SET TUBE SUCT SHAVER OUTFL 24K (TUBING) ×3 IMPLANT
SET TUBE TIP INTRA-ARTICULAR (MISCELLANEOUS) ×3 IMPLANT
SLING ULTRA II LG (MISCELLANEOUS) ×3 IMPLANT
STRAP BODY AND KNEE 60X3 (MISCELLANEOUS) ×3 IMPLANT
STRIP CLOSURE SKIN 1/2X4 (GAUZE/BANDAGES/DRESSINGS) ×2 IMPLANT
SUT ETHILON 3-0 FS-10 30 BLK (SUTURE) ×3
SUT ETHILON 4-0 (SUTURE) ×2
SUT ETHILON 4-0 FS2 18XMFL BLK (SUTURE) ×1
SUT LASSO 90 DEG SD STR (SUTURE) ×3 IMPLANT
SUT MNCRL 4-0 (SUTURE) ×2
SUT MNCRL 4-0 27XMFL (SUTURE) ×1
SUT PDSII 0 (SUTURE) ×6 IMPLANT
SUT VIC AB 2-0 CT1 27 (SUTURE) ×2
SUT VIC AB 2-0 CT1 TAPERPNT 27 (SUTURE) ×1 IMPLANT
SUT VIC AB 2-0 CT2 27 (SUTURE) ×3 IMPLANT
SUT VIC AB 3-0 SH 27 (SUTURE) ×2
SUT VIC AB 3-0 SH 27X BRD (SUTURE) ×1 IMPLANT
SUTURE EHLN 3-0 FS-10 30 BLK (SUTURE) ×1 IMPLANT
SUTURE ETHLN 4-0 FS2 18XMF BLK (SUTURE) ×1 IMPLANT
SUTURE MNCRL 4-0 27XMF (SUTURE) ×1 IMPLANT
SYR 10ML LL (SYRINGE) ×3 IMPLANT
TAPE MICROFOAM 4IN (TAPE) ×3 IMPLANT
TUBING ARTHRO INFLOW-ONLY STRL (TUBING) ×3 IMPLANT
TUBING CONNECTING 10 (TUBING) ×2 IMPLANT
TUBING CONNECTING 10' (TUBING) ×1
WAND HAND CNTRL MULTIVAC 90 (MISCELLANEOUS) ×3 IMPLANT
WAND MEGAVAC 90 (MISCELLANEOUS) ×3 IMPLANT
WRAPON POLAR PAD SHDR UNIV (MISCELLANEOUS) ×3

## 2018-01-30 NOTE — OR Nursing (Signed)
Family updated 1525

## 2018-01-30 NOTE — Anesthesia Preprocedure Evaluation (Signed)
Anesthesia Evaluation  Patient identified by MRN, date of birth, ID band Patient awake    Reviewed: Allergy & Precautions, NPO status , Patient's Chart, lab work & pertinent test results  History of Anesthesia Complications Negative for: history of anesthetic complications  Airway Mallampati: I  TM Distance: >3 FB Neck ROM: Full    Dental  (+)    Pulmonary sleep apnea and Continuous Positive Airway Pressure Ventilation , former smoker (quit smoking cigars 2016),    Pulmonary exam normal breath sounds clear to auscultation       Cardiovascular Exercise Tolerance: Good hypertension, Normal cardiovascular exam Rhythm:Regular Rate:Normal     Neuro/Psych PSYCHIATRIC DISORDERS Depression    GI/Hepatic negative GI ROS,   Endo/Other  negative endocrine ROS  Renal/GU negative Renal ROS     Musculoskeletal   Abdominal   Peds  Hematology negative hematology ROS (+)   Anesthesia Other Findings BPH  Reproductive/Obstetrics                             Anesthesia Physical Anesthesia Plan  ASA: II  Anesthesia Plan: General and Regional   Post-op Pain Management:  Regional for Post-op pain and GA combined w/ Regional for post-op pain   Induction: Intravenous  PONV Risk Score and Plan: 2 and Dexamethasone and Ondansetron  Airway Management Planned: Oral ETT  Additional Equipment:   Intra-op Plan:   Post-operative Plan: Extubation in OR  Informed Consent: I have reviewed the patients History and Physical, chart, labs and discussed the procedure including the risks, benefits and alternatives for the proposed anesthesia with the patient or authorized representative who has indicated his/her understanding and acceptance.     Plan Discussed with: CRNA  Anesthesia Plan Comments:         Anesthesia Quick Evaluation

## 2018-01-30 NOTE — Transfer of Care (Signed)
Immediate Anesthesia Transfer of Care Note  Patient: Alexander Patterson  Procedure(s) Performed: SHOULDER ARTHROSCOPY WITH OPEN ROTATOR CUFF REPAIR VS REGENETEN PATCH SUBACROMIAL DECOMPRESSION, SUBPECTORAL BICEPS TENODESIS (Right Shoulder)  Patient Location: PACU  Anesthesia Type: General, Regional  Level of Consciousness: awake, alert  and patient cooperative  Airway and Oxygen Therapy: Patient Spontanous Breathing and Patient connected to supplemental oxygen  Post-op Assessment: Post-op Vital signs reviewed, Patient's Cardiovascular Status Stable, Respiratory Function Stable, Patent Airway and No signs of Nausea or vomiting  Post-op Vital Signs: Reviewed and stable  Complications: No apparent anesthesia complications

## 2018-01-30 NOTE — Anesthesia Postprocedure Evaluation (Signed)
Anesthesia Post Note  Patient: Alexander Patterson  Procedure(s) Performed: SHOULDER ARTHROSCOPY WITH OPEN ROTATOR CUFF REPAIR VS REGENETEN PATCH SUBACROMIAL DECOMPRESSION, SUBPECTORAL BICEPS TENODESIS (Right Shoulder)  Patient location during evaluation: PACU Anesthesia Type: Regional Level of consciousness: awake and alert, oriented and patient cooperative Pain management: pain level controlled Vital Signs Assessment: post-procedure vital signs reviewed and stable Respiratory status: spontaneous breathing, nonlabored ventilation and respiratory function stable Cardiovascular status: blood pressure returned to baseline and stable Postop Assessment: adequate PO intake Anesthetic complications: no    Reed BreechAndrea Shilo Philipson

## 2018-01-30 NOTE — H&P (Signed)
Paper H&P to be scanned into permanent record. H&P reviewed. No significant changes noted.  

## 2018-01-30 NOTE — Discharge Instructions (Signed)
Post-Op Instructions - Regeneten Patch  1. Bracing: You will wear a shoulder immobilizer or sling for 1 week.   2. Driving: No driving for at least 2 weeks post-op.   3. Activity: Progress to motion as tolerated, moving from passive to active-assisted to active motion. For the first 4 weeks, forward flexion is limited to 100. External rotation with the arm by the side is allowed, but abduction-external rotation is not allowed for the first 6 weeks. After 6 weeks, no restrictions on motion or arm use. Return to normal activities normally takes 4-6 months on average. If rehab goes very well, may be able to do most activities at 4 months, except overhead or contact sports.  4. Physical Therapy: Begins 3-4 days after surgery  5. Medications:  - You will be provided a prescription for narcotic pain medicine. After surgery, take 1-2 narcotic tablets every 4 hours if needed for severe pain.  - A prescription for anti-nausea medication will be provided in case the narcotic medicine causes nausea - take 1 tablet every 6 hours only if nauseated.   - Take tylenol 1000 mg (2 Extra Strength tablets or 3 regular strength) every 8 hours for pain.  May decrease or stop tylenol 5 days after surgery if you are having minimal pain. - DO NOT take ANY nonsteroidal anti-inflammatory pain medications (Advil, Motrin, Ibuprofen, Aleve, Naproxen, or Naprosyn). These medicines can inhibit healing of your shoulder repair.    If you are taking prescription medication for anxiety, depression, insomnia, muscle spasm, chronic pain, or for attention deficit disorder, you are advised that you are at a higher risk of adverse effects with use of narcotics post-op, including narcotic addiction/dependence, depressed breathing, death. If you use non-prescribed substances: alcohol, marijuana, cocaine, heroin, methamphetamines, etc., you are at a higher risk of adverse effects with use of narcotics post-op, including narcotic  addiction/dependence, depressed breathing, death. You are advised that taking > 50 morphine milligram equivalents (MME) of narcotic pain medication per day results in twice the risk of overdose or death. For your prescription provided: oxycodone 5 mg - taking more than 6 tablets per day would result in > 50 morphine milligram equivalents (MME) of narcotic pain medication. Be advised that we will prescribe narcotics short-term, for acute post-operative pain only - 3 weeks for major operations such as shoulder repair/reconstruction surgeries.     6. Post-Op Appointment:  Your first post-op appointment will be 10-14 days post-op.  7. Work or School: For most, but not all procedures, we advise staying out of work or school for at least 1 to 2 weeks in order to recover from the stress of surgery and to allow time for healing.   If you need a work or school note this can be provided.   8. Smoking: If you are a smoker, you need to refrain from smoking in the postoperative period. The nicotine in cigarettes will inhibit healing of your shoulder repair and decrease the chance of successful repair. Similarly, nicotine containing products (gum, patches) should be avoided.   Post-operative Brace: Apply and remove the brace you received as you were instructed to at the time of fitting and as described in detail as the braces instructions for use indicate.  Wear the brace for the period of time prescribed by your physician.  The brace can be cleaned with soap and water and allowed to air dry only.  Should the brace result in increased pain, decreased feeling (numbness/tingling), increased swelling or an overall worsening of  your medical condition, please contact your doctor immediately.  If an emergency situation occurs as a result of wearing the brace after normal business hours, please dial 911 and seek immediate medical attention.  Let your doctor know if you have any further questions about the brace issued  to you. Refer to the shoulder sling instructions for use if you have any questions regarding the correct fit of your shoulder sling.  [facility] Customer Care for Troubleshooting: [phone]  Video that illustrates how to properly use a shoulder sling: "Instructions for Proper Use of an Orthopaedic Sling" http://bass.com/https://www.youtube.com/watch?v=AHZpn_Xo45w        General Anesthesia, Adult, Care After These instructions provide you with information about caring for yourself after your procedure. Your health care provider may also give you more specific instructions. Your treatment has been planned according to current medical practices, but problems sometimes occur. Call your health care provider if you have any problems or questions after your procedure. What can I expect after the procedure? After the procedure, it is common to have:  Vomiting.  A sore throat.  Mental slowness.  It is common to feel:  Nauseous.  Cold or shivery.  Sleepy.  Tired.  Sore or achy, even in parts of your body where you did not have surgery.  Follow these instructions at home: For at least 24 hours after the procedure:  Do not: ? Participate in activities where you could fall or become injured. ? Drive. ? Use heavy machinery. ? Drink alcohol. ? Take sleeping pills or medicines that cause drowsiness. ? Make important decisions or sign legal documents. ? Take care of children on your own.  Rest. Eating and drinking  If you vomit, drink water, juice, or soup when you can drink without vomiting.  Drink enough fluid to keep your urine clear or pale yellow.  Make sure you have little or no nausea before eating solid foods.  Follow the diet recommended by your health care provider. General instructions  Have a responsible adult stay with you until you are awake and alert.  Return to your normal activities as told by your health care provider. Ask your health care provider what activities are  safe for you.  Take over-the-counter and prescription medicines only as told by your health care provider.  If you smoke, do not smoke without supervision.  Keep all follow-up visits as told by your health care provider. This is important. Contact a health care provider if:  You continue to have nausea or vomiting at home, and medicines are not helpful.  You cannot drink fluids or start eating again.  You cannot urinate after 8-12 hours.  You develop a skin rash.  You have fever.  You have increasing redness at the site of your procedure. Get help right away if:  You have difficulty breathing.  You have chest pain.  You have unexpected bleeding.  You feel that you are having a life-threatening or urgent problem. This information is not intended to replace advice given to you by your health care provider. Make sure you discuss any questions you have with your health care provider. Document Released: 03/25/2001 Document Revised: 05/21/2016 Document Reviewed: 12/01/2015 Elsevier Interactive Patient Education  [employer]2018 [employer]

## 2018-01-30 NOTE — Anesthesia Procedure Notes (Signed)
Procedure Name: Intubation Date/Time: 01/30/2018 1:30 PM Performed by: Janna Arch, CRNA Pre-anesthesia Checklist: Patient identified, Emergency Drugs available, Suction available and Patient being monitored Patient Re-evaluated:Patient Re-evaluated prior to induction Oxygen Delivery Method: Circle system utilized Preoxygenation: Pre-oxygenation with 100% oxygen Induction Type: IV induction Ventilation: Two handed mask ventilation required and Oral airway inserted - appropriate to patient size Laryngoscope Size: Mac and 3 Grade View: Grade I Tube type: Oral Tube size: 7.5 mm Number of attempts: 1 Airway Equipment and Method: Patient positioned with wedge pillow Placement Confirmation: ETT inserted through vocal cords under direct vision,  positive ETCO2 and breath sounds checked- equal and bilateral Secured at: 23 cm Tube secured with: Tape Dental Injury: Teeth and Oropharynx as per pre-operative assessment

## 2018-01-30 NOTE — Anesthesia Procedure Notes (Signed)
Anesthesia Regional Block: Interscalene brachial plexus block   Pre-Anesthetic Checklist: ,, timeout performed, Correct Patient, Correct Site, Correct Laterality, Correct Procedure, Correct Position, site marked, Risks and benefits discussed,  Surgical consent,  Pre-op evaluation,  At surgeon's request and post-op pain management  Laterality: Right  Prep: chloraprep       Needles:  Injection technique: Single-shot  Needle Type: Stimiplex     Needle Length: 5cm  Needle Gauge: 21     Additional Needles:   Procedures:,,,, ultrasound used (permanent image in chart),,,,  Narrative:  Start time: 01/30/2018 12:42 PM End time: 01/30/2018 12:49 PM Injection made incrementally with aspirations every 5 mL.  Performed by: Personally  Anesthesiologist: Reed BreechMazzoni, Kalena Mander, MD  Additional Notes: Functioning IV was confirmed and monitors applied. Ultrasound guidance: relevant anatomy identified, needle position confirmed, local anesthetic spread visualized around nerve(s)., vascular puncture avoided.  Image printed for medical record.  Negative aspiration and no paresthesias; incremental administration of local anesthetic for a total of 40ml ropivacaine 0.5%. The patient tolerated the procedure well. Vitals signes recorded in RN notes.

## 2018-02-02 NOTE — Op Note (Signed)
SURGERY DATE: @TODAY @  PRE-OP DIAGNOSIS:  1. Right subacromial impingement 2. Right biceps tendinopathy 3. Right rotator cuff tear 4. Right acromioclavicular joint osteoarthritis  POST-OP DIAGNOSIS: 1. Right subacromial impingement 2. Right biceps tendinopathy 3. Right rotator cuff tear 4. Right acromioclavicular joint osteoarthritis  PROCEDURES:  1. Right arthroscopic rotator cuff repair with Regeneten patch 2. Right open subpectoral biceps tenodesis 3. Right distal clavicle excision 4. Right extensive debridement of shoulder (glenohumeral and subacromial spaces) 5. Right subacromial decompression  SURGEON: Rosealee AlbeeSunny H. Gracelin Weisberg, MD  ANESTHESIA: Gen with interscalene block  ESTIMATED BLOOD LOSS: 10cc  DRAINS:  none  TOTAL IV FLUIDS: per anesthesia   SPECIMENS: none  IMPLANTS:  - [employer] Regeneten patch with associated tendon and bone staples - Arthrex - Double loaded FiberTak Suture Anchor - x1  OPERATIVE FINDINGS:  Examination under anesthesia: A careful examination under anesthesia was performed.  Passive range of motion was: FF: 160; ER at side: 50; ER in abduction: NT; IR in abduction: NT.  Anterior load shift: NT.  Posterior load shift: NT.  Sulcus in neutral: NT.  Sulcus in ER: NT.    Intra-operative findings: A thorough arthroscopic examination of the shoulder was performed.  The findings are: 1. Biceps tendon: tendinopathy with significant erythema 2. Superior labrum: Type 2 SLAP tear 3. Posterior labrum and capsule: normal 4. Inferior capsule and inferior recess: normal 5. Glenoid cartilage surface: Grade 1-2 degenerative changes near mid-portion of glenoid 6. Supraspinatus attachment: normal on articular surface, mild thinning on bursal side 7. Posterior rotator cuff attachment: normal 8. Humeral head articular cartilage: normal 9. Rotator interval: normal 10: Subscapularis tendon: normal 11. Anterior labrum: degenerative 12. IGHL: normal  OPERATIVE  REPORT:   Indications for procedure: Carma LeavenRonald K Menger is a 63 y.o. year old male with chronic R shoulder pain that has failed nonoperative management including rest, activity modification, physical therapy, and/or corticosteroid injection. MRI showed a partial thickness rotator cuff tear. After discussion of risks, benefits, and alternatives to surgery, the patient elected to proceed with above mentioned procedure. The patient understands that use of the Regeneten patch is relatively new and long-term data is unknown.  Procedure in detail:  I identified Carma LeavenRonald K Masella in the pre-operative holding area.  I marked the operative shoulder with my initials. I reviewed the risks and benefits of the proposed surgical intervention, and the patient (and/or patient's guardian) wished to proceed.  Anesthesia was then performed with an interscalene block.  The patient was transferred to the operative suite and placed in the beach chair position.    SCDs were placed on the lower extremities. Appropriate IV antibiotics were administered within 1 hour before incision. The operative upper extremity was then prepped and draped in standard fashion. A time out was performed confirming the correct extremity, correct patient and correct procedure.   I then created a standard posterior portal with an 11 blade. The glenohumeral joint was easily entered with a blunt trochar and the arthroscope introduced. The findings of diagnostic arthroscopy are described above. I debrided degenerative tissue including labrum and cartilaginous surfaces and also coagulated the inflamed synovium to obtain hemostasis and reduce the risk of post-operative swelling using an Arthrocare radiofrequency device. Two spinal needles were placed posterior to the biceps tendon and 0-PDS suture was passed through them. This marked the anterior edge of the supraspinatus tendon.  I performed a biceps tenotomy using an arthroscopic scissor and used a motorized  shaver to debride the stump back to a stable  base.   I then directed my attention to the open subpectoral biceps tenodesis. I made an ~5cm incision parallel to the skin relaxation lines along the medial upper arm, such that 1/3 of the incision was above the pectoralis major tendon and 2/3 below it. Sharp dissection was carried down to the fascia overlying the pectoralis and the short head of the biceps. Bovie electrocautery was used to achieve hemostasis. I made a vertical nick in the fascia in line with the short head of the biceps. The pectoralis tendon was then retracted with a large Hohmann retractor, and the muscle belly of the short head of the biceps was retracted medially using a spade, keeping the retractor snuggly against the medial humerus to avoid neurovascular injury. An [employer] retractor was used to retract inferiorly. This afforded excellent visualization of the long head of the biceps tendon. It was delivered out of the incision. I then placed a double-loaded Arthrex FiberTak all-suture anchor at the most proximal and central portion of the inter-tubercular groove that I could visualize. I took one set of tapes and with the use of a free needle, passed one strand through the biceps tendon once and passed the other strand through the biceps in a locking fashion. Suture tapes were passed through the biceps tendon at the level of the musculocutaneous junction. This was also done for the other tape as well. This configuration allowed me to then parachute the tendon back into the wound as the two ends of the suture tape were tied together with a Surgeon's knot. This was repeated for the other strand of suture tape. The excess proximal tendon was sharply excised. The tension in the tenodesed long head was excellent. The wound was thoroughly irrigated with saline, and the incision closed in layers using 3-0 Vicryl for the deep dermis and a running subcuticular 4-0 Monocryl for skin. A thin layer of  Dermabond was applied.   Next, the arthroscope was then introduced into the subacromial space. A direct lateral portal was created with an 11-blade after spinal needle localization. An extensive subacromial bursectomy was performed using a combination of the shaver and Arthrocare wand. The entire acromial undersurface was exposed and the CA ligament was subperiosteally elevated to expose the anterior acromial hook. A burr was used to create a flat anterior and lateral aspect of the acromion, converting it from a Type 2 to a Type 1 acromion. Care was made to keep the deltoid fascia intact.  Next, I exposed the acromioclavicular joint using a combination of shaver and arthrocare wand. The distal 39m of clavicle was removed using a 5.25mm burr (2 burr widths removed). Adequate resection was confirmed by placing the camera into the anterior portal. Care was taken to preserve the superior and posterior capsule.   Then, I examined the supraspinatus tendon. There was some thinning near the lateral edge of the insertion, but there was no region of full-thickness tear. We decided to proceed with Regeneten patch placement. Anterior and posterior cannulas were placed just lateral to the acromion. The Regeneten patch delivery gun was placed into the lateral portal and patch was delivered over the supraspinatus tendon and positioned just posterior to the previously passed 0-PDS suture with care taken to make sure the lateral edge of the implant was lateral to the entirety of the rotator cuff footprint. Tendon staples were placed medially, anteriorly, and posteriorly through the anterior and posterior cannulas. A bone staple was then placed laterally. The patch was then probed to confirm appropriate  stability.   Fluid was evacuated from the shoulder, and the portals were closed with 3-0 Nylon. Xeroform was applied to the portals. A sterile dressing was applied, followed by a Polar Care sleeve and a SlingShot shoulder  immobilizer/sling. The patient awoke from anesthesia without difficulty and was transferred to the PACU in stable condition.     COMPLICATIONS: none  DISPOSITION: plan for discharge home after recovery in PACU   POSTOPERATIVE PLAN: Remain in sling (except hygiene and elbow/wrist/hand RoM exercises as instructed by PT) x 4 weeks and NWB for this time. PT to begin 3-4 days after surgery. Use biceps tenodesis protocol as Regeneten patch protocol is too aggressive for biceps tenodesis healing.

## 2018-02-27 ENCOUNTER — Encounter: Admission: RE | Payer: Self-pay | Source: Ambulatory Visit

## 2018-02-27 ENCOUNTER — Ambulatory Visit
Admission: RE | Admit: 2018-02-27 | Payer: BLUE CROSS/BLUE SHIELD | Source: Ambulatory Visit | Admitting: Gastroenterology

## 2018-02-27 SURGERY — COLONOSCOPY WITH PROPOFOL
Anesthesia: General

## 2018-09-30 DEATH — deceased

## 2021-10-12 ENCOUNTER — Other Ambulatory Visit: Payer: Self-pay | Admitting: Orthopedic Surgery

## 2021-10-12 DIAGNOSIS — S46102D Unspecified injury of muscle, fascia and tendon of long head of biceps, left arm, subsequent encounter: Secondary | ICD-10-CM

## 2021-10-12 DIAGNOSIS — M25312 Other instability, left shoulder: Secondary | ICD-10-CM

## 2021-10-12 DIAGNOSIS — S46002D Unspecified injury of muscle(s) and tendon(s) of the rotator cuff of left shoulder, subsequent encounter: Secondary | ICD-10-CM

## 2021-10-24 ENCOUNTER — Ambulatory Visit
Admission: RE | Admit: 2021-10-24 | Discharge: 2021-10-24 | Disposition: A | Discharge status: Home / Self Care | Payer: Medicare Other | Source: Ambulatory Visit | Attending: Orthopedic Surgery | Admitting: Orthopedic Surgery

## 2021-10-24 ENCOUNTER — Other Ambulatory Visit: Payer: Self-pay

## 2021-10-24 DIAGNOSIS — S46002D Unspecified injury of muscle(s) and tendon(s) of the rotator cuff of left shoulder, subsequent encounter: Secondary | ICD-10-CM | POA: Insufficient documentation

## 2021-10-24 DIAGNOSIS — M25312 Other instability, left shoulder: Secondary | ICD-10-CM | POA: Insufficient documentation

## 2021-10-24 DIAGNOSIS — S46102D Unspecified injury of muscle, fascia and tendon of long head of biceps, left arm, subsequent encounter: Secondary | ICD-10-CM | POA: Insufficient documentation

## 2022-03-16 ENCOUNTER — Other Ambulatory Visit: Payer: Self-pay | Admitting: Pediatrics

## 2022-03-16 DIAGNOSIS — Z0000 Encounter for general adult medical examination without abnormal findings: Secondary | ICD-10-CM

## 2022-03-16 DIAGNOSIS — Z136 Encounter for screening for cardiovascular disorders: Secondary | ICD-10-CM

## 2022-03-16 DIAGNOSIS — Z87891 Personal history of nicotine dependence: Secondary | ICD-10-CM

## 2022-03-28 ENCOUNTER — Other Ambulatory Visit: Payer: Self-pay

## 2022-03-28 ENCOUNTER — Ambulatory Visit
Admission: RE | Admit: 2022-03-28 | Discharge: 2022-03-28 | Disposition: A | Discharge status: Home / Self Care | Payer: Medicare Other | Source: Ambulatory Visit | Attending: Pediatrics | Admitting: Pediatrics

## 2022-03-28 DIAGNOSIS — Z136 Encounter for screening for cardiovascular disorders: Secondary | ICD-10-CM | POA: Insufficient documentation

## 2022-03-28 DIAGNOSIS — Z87891 Personal history of nicotine dependence: Secondary | ICD-10-CM | POA: Insufficient documentation

## 2022-07-10 IMAGING — MR MR SHOULDER*L* W/O CM
4 of 5 series · 29 of 40 positions shown · non-contrast
Comparison: None.

CLINICAL DATA: Patient complains of left shoulder pain that
radiates to mid arm. Patient reports a trip and fall 1.5 months ago.
No history of surgery reported.

EXAM:
MRI OF THE LEFT SHOULDER WITHOUT CONTRAST
TECHNIQUE: Multiplanar, multisequence MR imaging of the shoulder was performed.
No intravenous contrast was administered.

[Series 6: T2 fat-sat · axial · left · 4.0mm · 0.44mm/px · z∈[-107,+5]mm · 8 of 26 slices shown (1 of 3)]
[im 1/26]
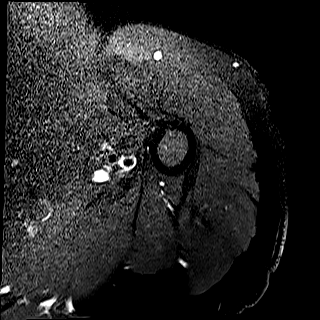
[im 4/26]
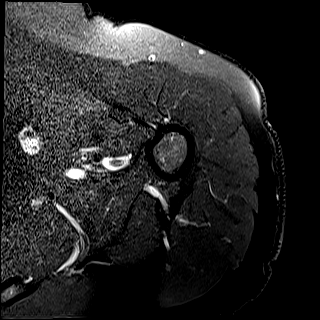
[im 8/26]
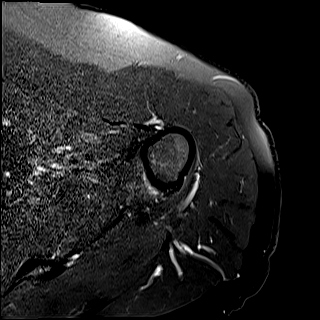
[im 11/26]
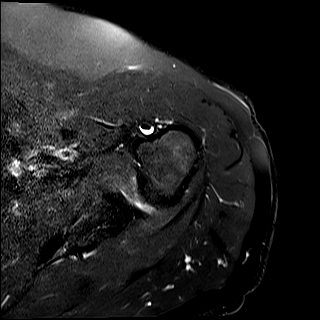
[im 15/26]
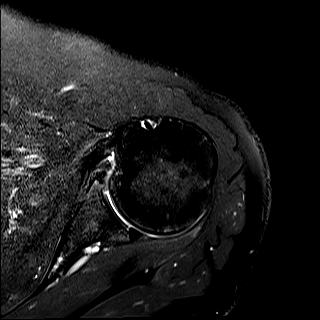
[im 18/26]
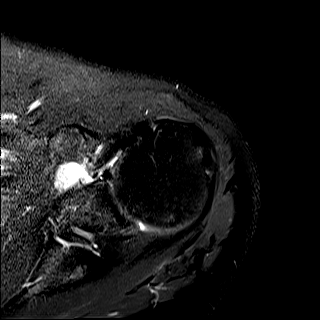
[im 22/26]
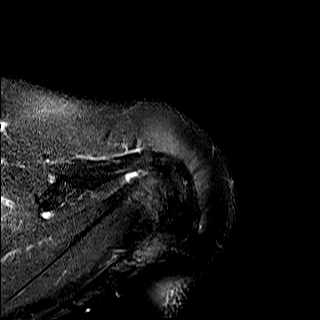
[im 26/26]
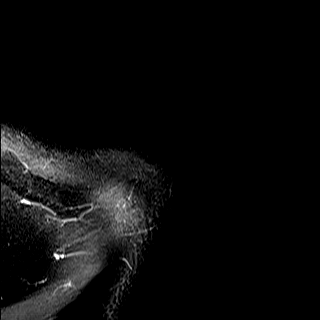

[Series 7: PD · oblique · left · 4.0mm · 0.44mm/px · 9 of 26 slices shown]
[im 1/26]
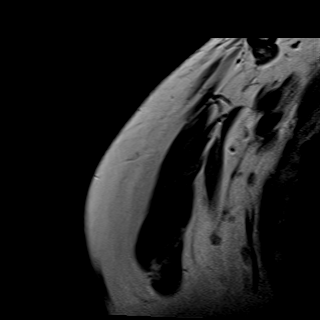
[im 4/26]
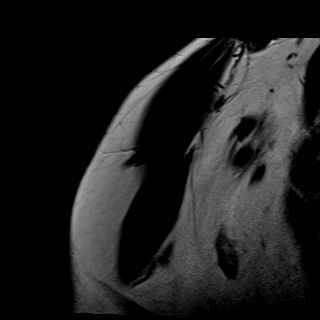
[im 7/26]
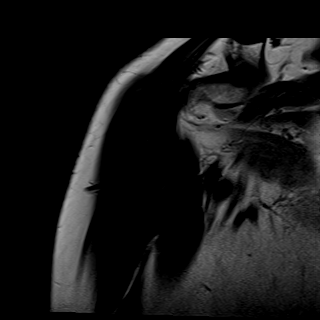
[im 10/26]
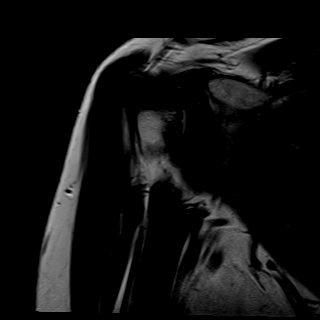
[im 13/26]
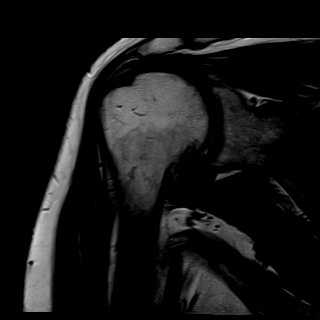
[im 16/26]
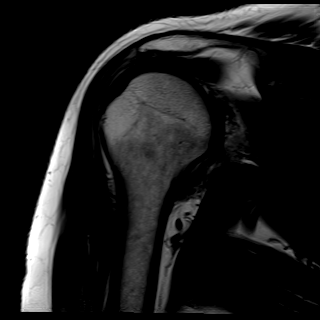
[im 19/26]
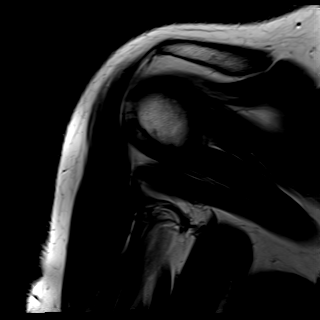
[im 22/26]
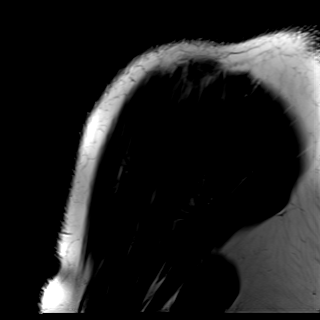
[im 26/26]
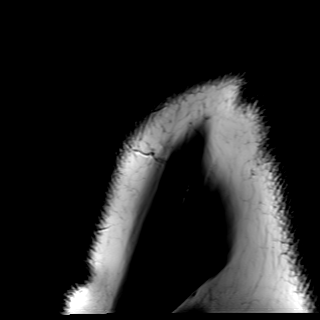

[Series 8: T2 fat-sat · oblique · left · 4.0mm · 0.22mm/px · 7 of 22 slices shown (2 of 3)]
[im 1/22]
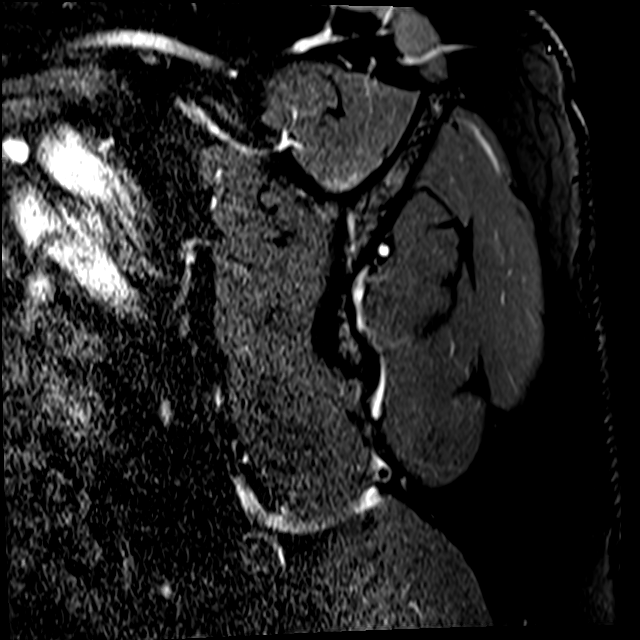
[im 4/22]
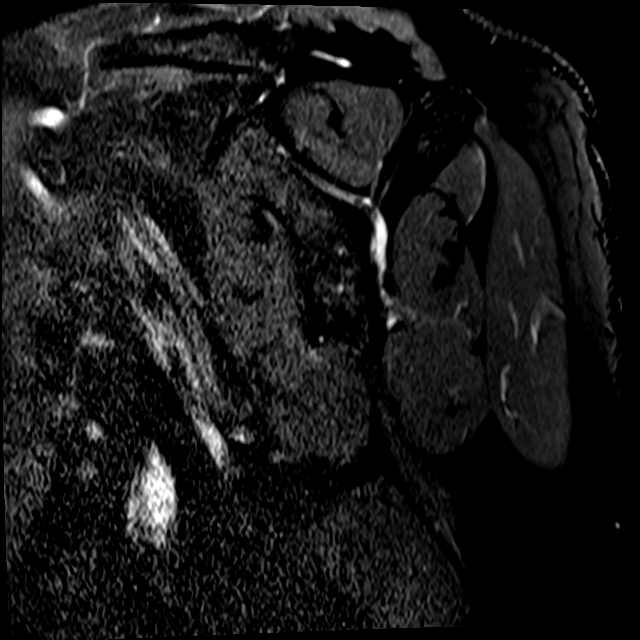
[im 8/22]
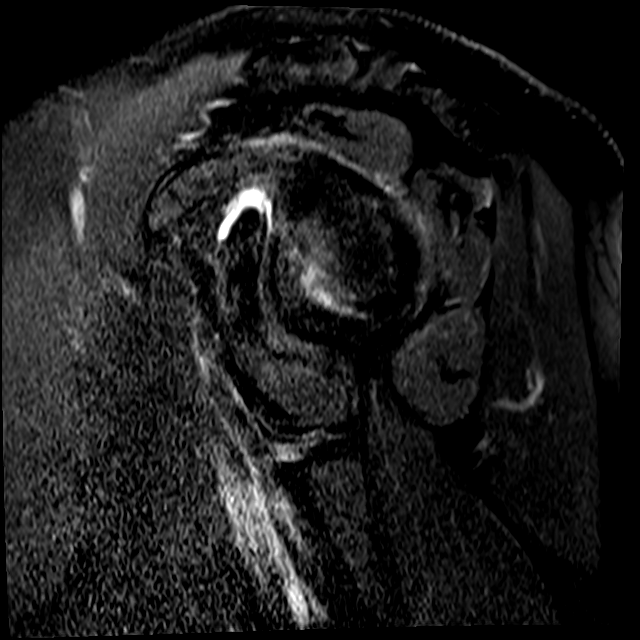
[im 11/22]
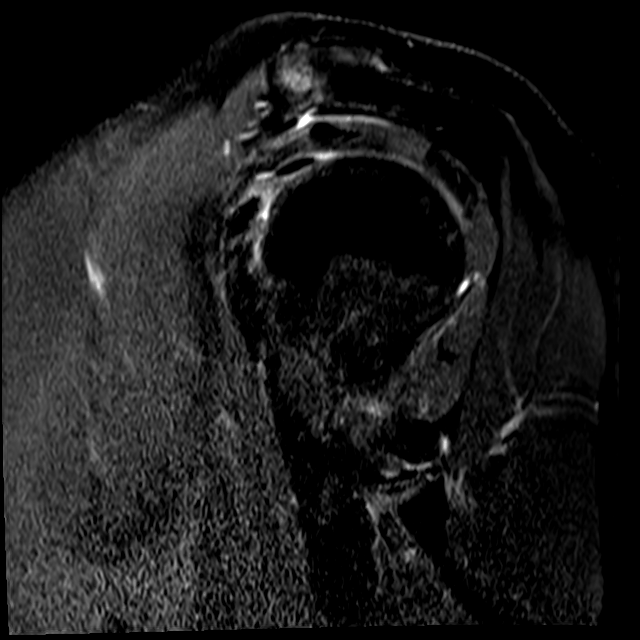
[im 15/22]
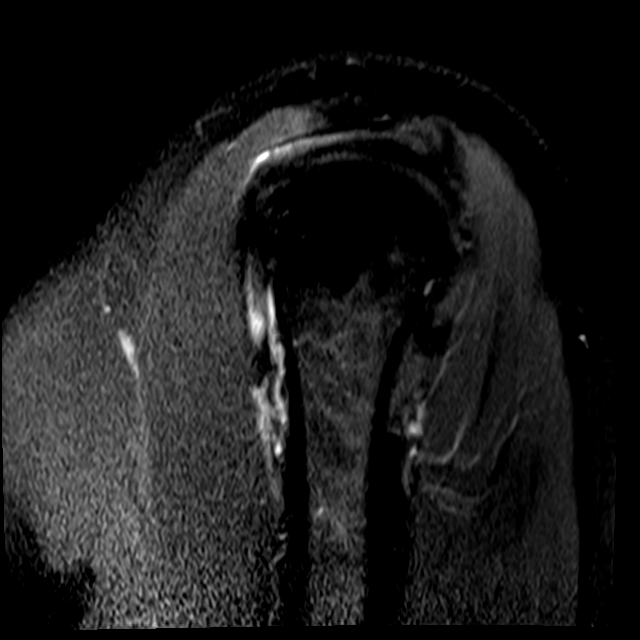
[im 18/22]
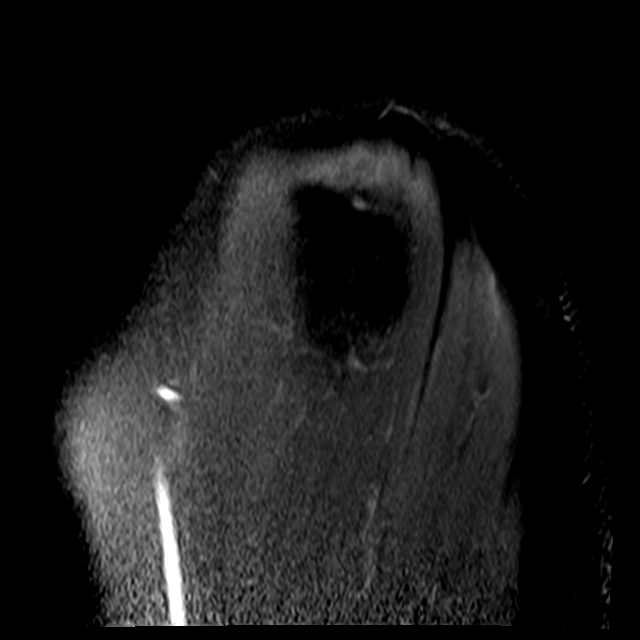
[im 22/22]
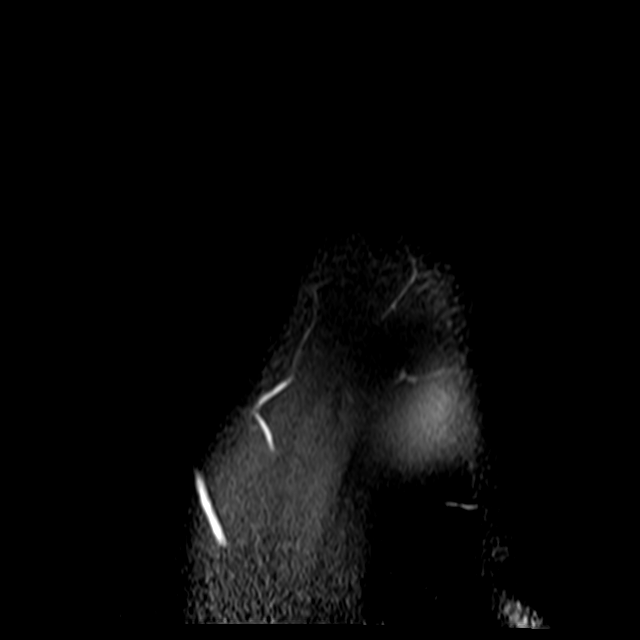

[Series 1008: T2 fat-sat · oblique · left · 4.0mm · 0.44mm/px · 5 of 26 slices shown (3 of 3)]
[im 1/26]
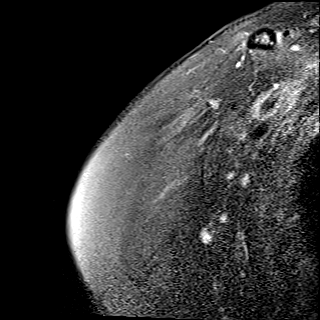
[im 4/26]
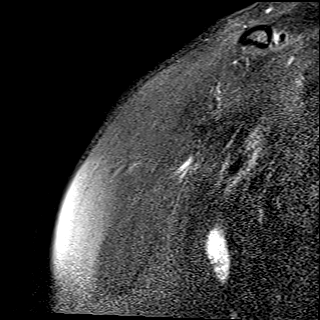
[im 7/26]
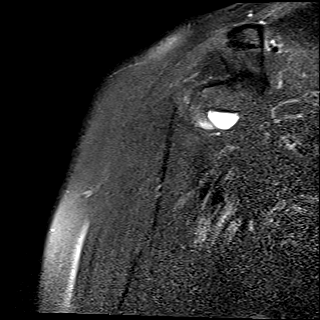
[im 13/26]
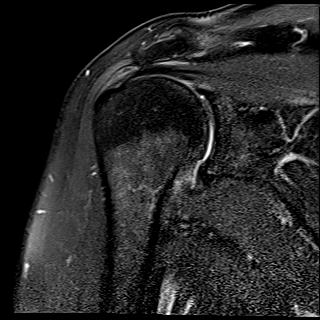
[im 22/26]
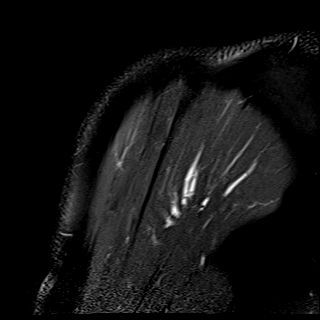

[29 of 40 positions shown; findings below may reference images not displayed]

FINDINGS: Rotator cuff: Mild tendinosis of the supraspinatus tendon with
fraying along the anterior bursal surface. Infraspinatus tendon is
intact. Teres minor tendon is intact. Subscapularis tendon is
intact.

Muscles: No atrophy or abnormal signal of the muscles of the rotator
cuff.

Biceps long head: Intact and normally positioned.

Acromioclavicular Joint: Mild arthropathy of the acromioclavicular
joint. Type I acromion. No subacromial/subdeltoid bursal fluid.

Glenohumeral Joint: No joint effusion.  No chondral defect.

Labrum: Grossly intact, but evaluation is limited by lack of
intraarticular fluid.

Bones: No marrow abnormality, fracture, or dislocation.

Other: None.
IMPRESSION: 1. Mild tendinosis of the supraspinatus tendon with fraying along
the anterior bursal surface.

## 2022-12-12 IMAGING — US US ABDOMINAL AORTA SCREENING AAA
1 series · 14 of 25 positions shown · non-contrast
Comparison: None.

CLINICAL DATA: Male between 65-75 years of age with a smoking
history.

EXAM:
US ABDOMINAL AORTA MEDICARE SCREENING
TECHNIQUE: Ultrasound examination of the abdominal aorta was performed as a
screening evaluation for abdominal aortic aneurysm.

[Series 1: us abdominal aorta screening aaa · 0.33mm/px · 14 of 27 slices shown]
[im 1/27]
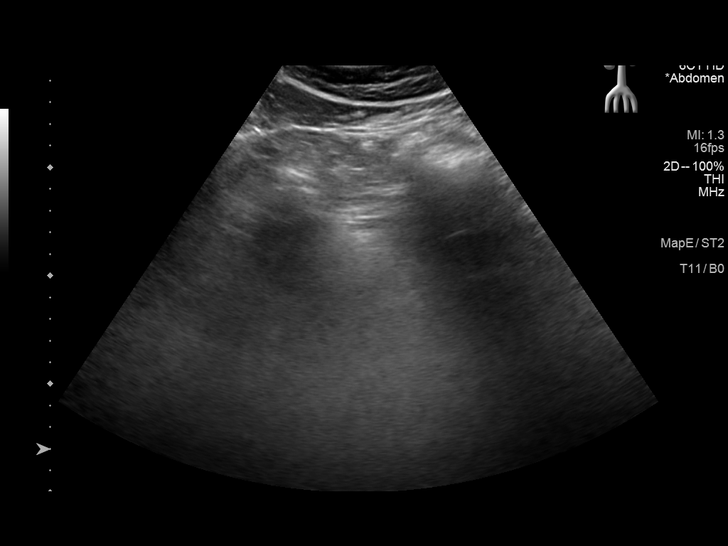
[im 3/27]
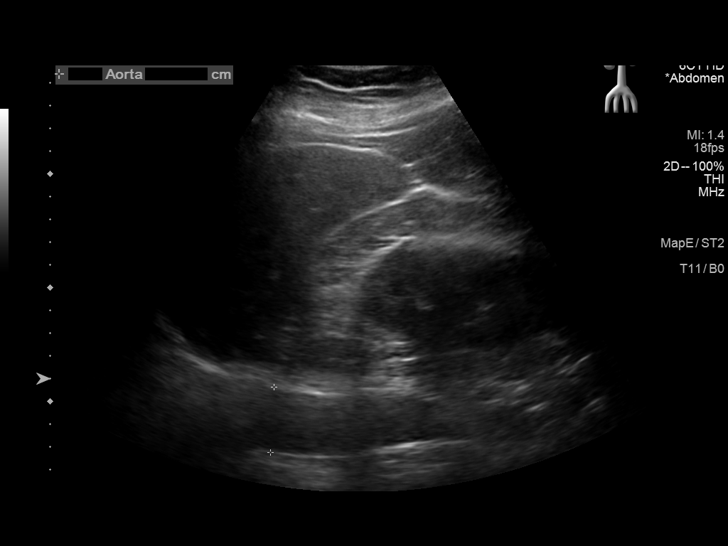
[im 5/27]
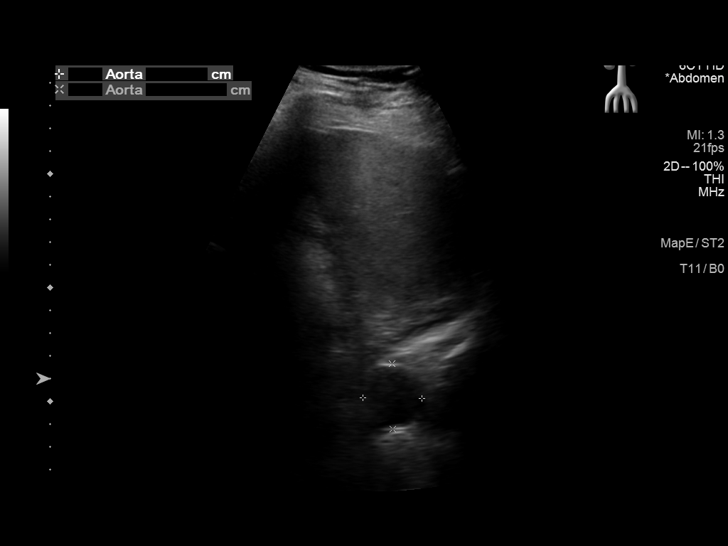
[im 7/27]
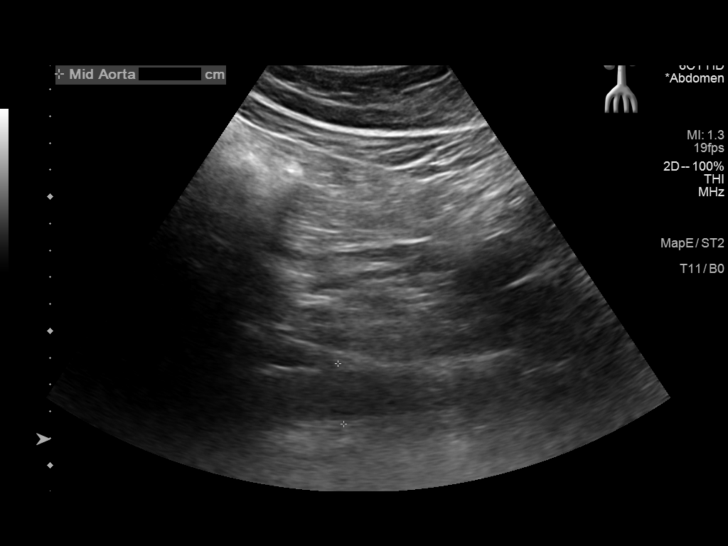
[im 9/27]
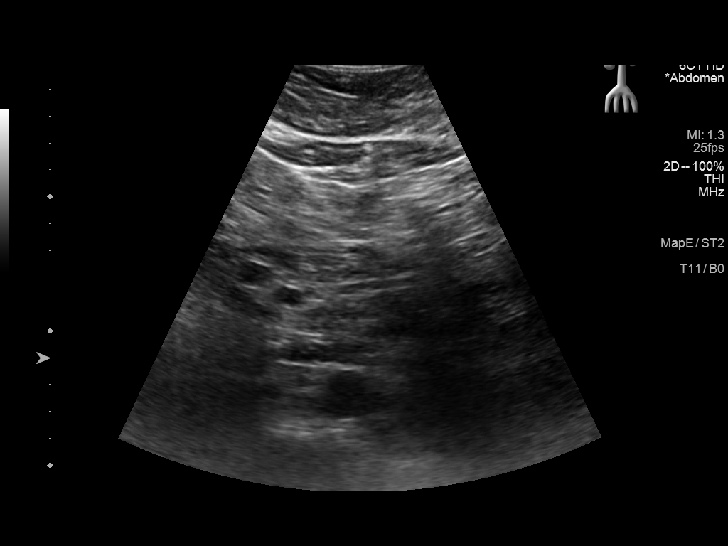
[im 10/27]
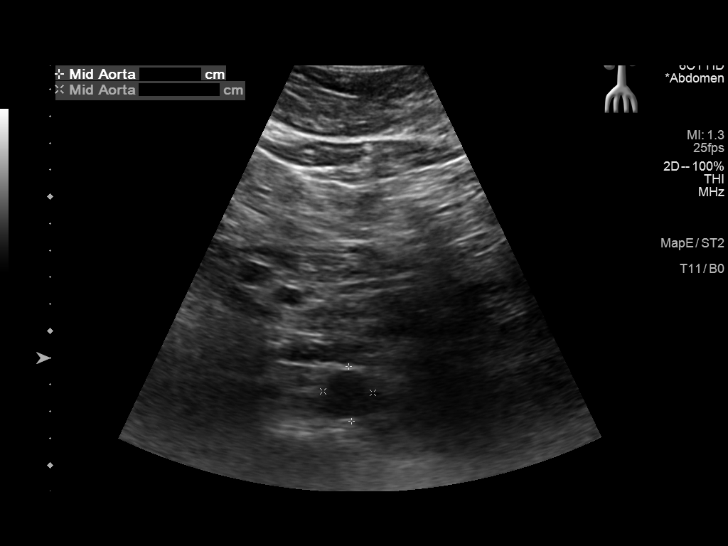
[im 12/27]
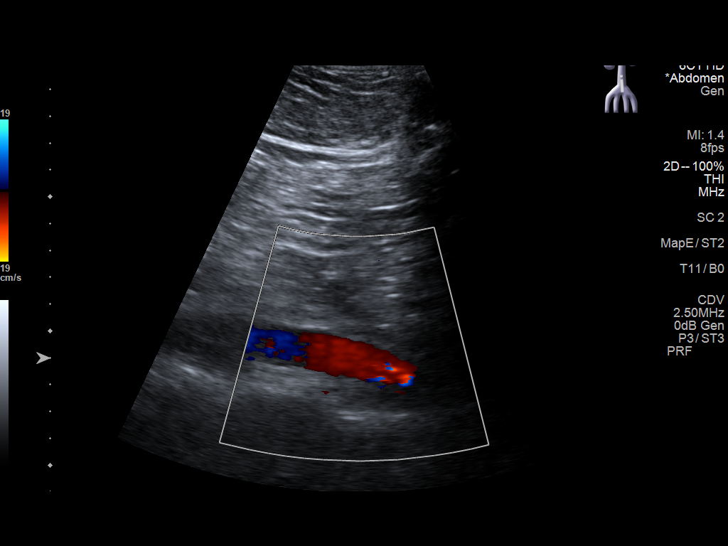
[im 15/27]
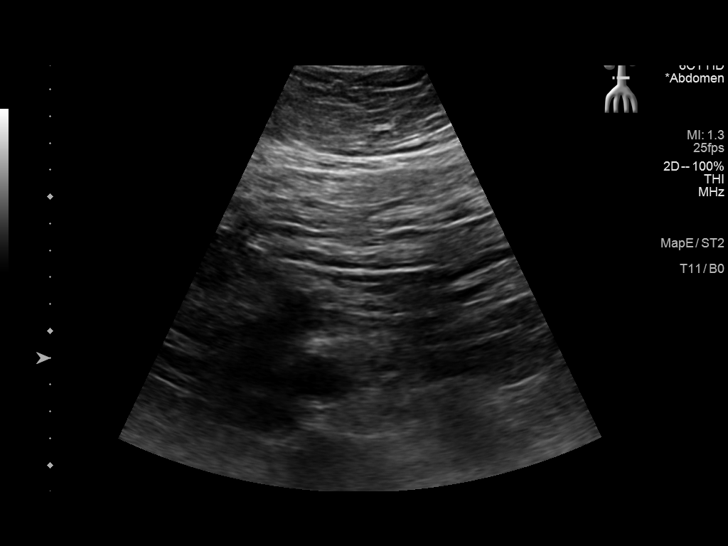
[im 17/27]
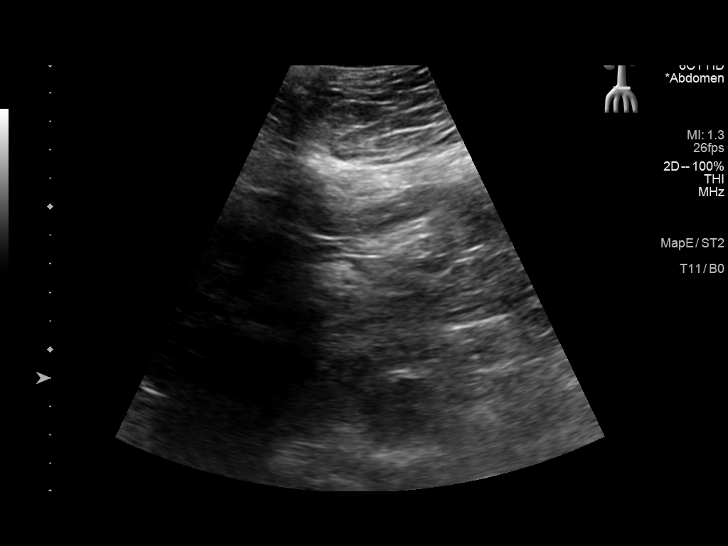
[im 18/27]
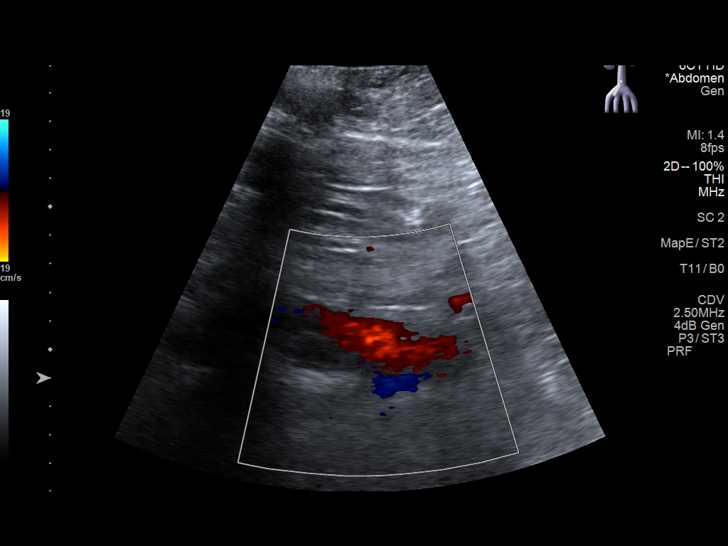
[im 20/27]
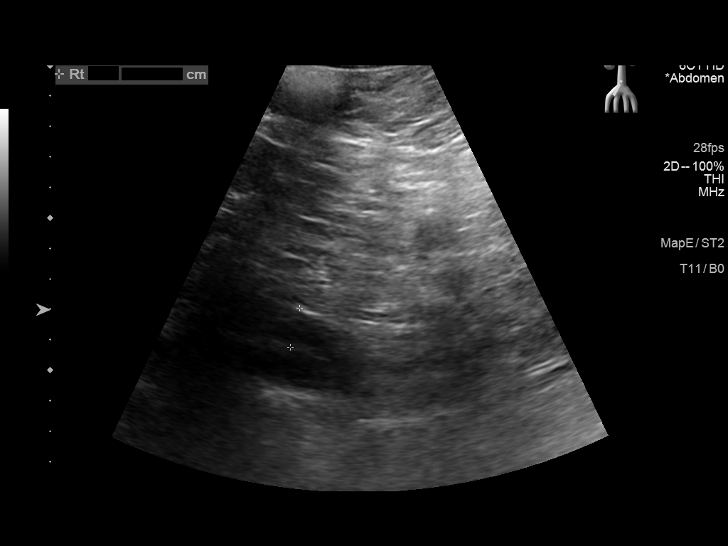
[im 22/27]
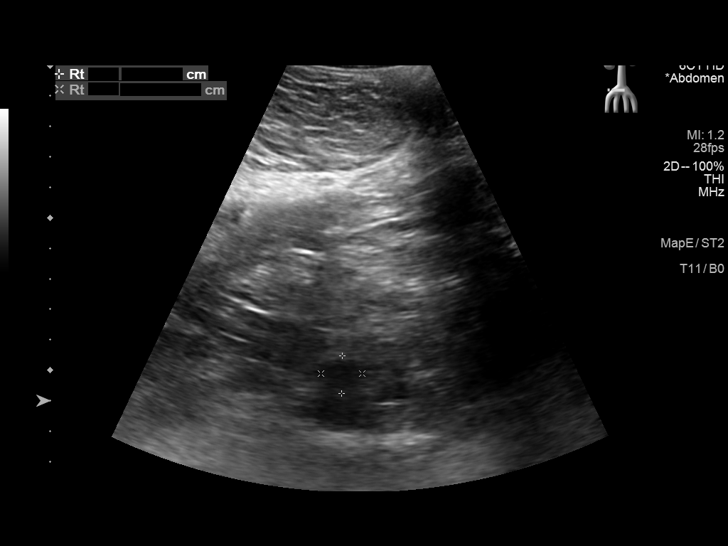
[im 24/27]
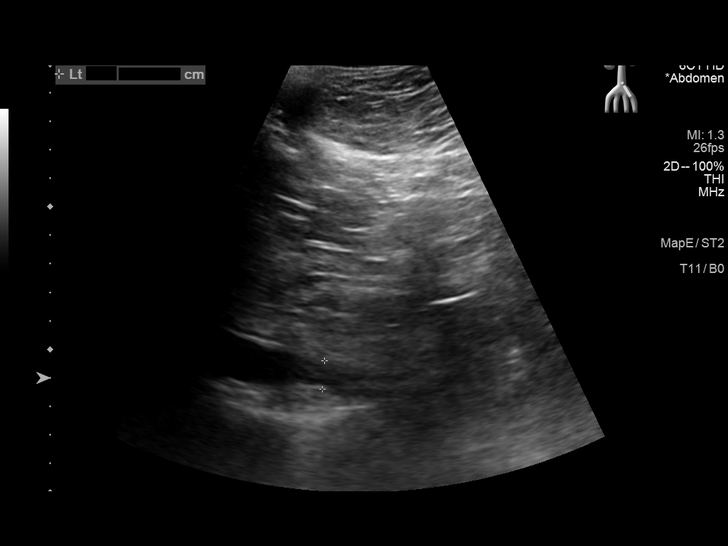
[im 27/27]
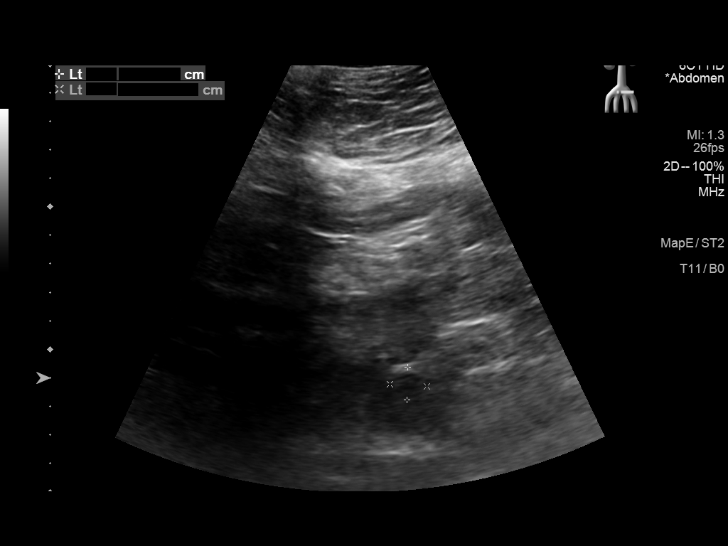

[14 of 25 positions shown; findings below may reference images not displayed]

FINDINGS: Abdominal aortic measurements as follows:

Proximal:  2.9 cm

Mid:  2.3 cm

Distal:  2.2 cm
IMPRESSION: No evidence of abdominal aortic aneurysm is noted.

## 2024-10-22 ENCOUNTER — Other Ambulatory Visit: Payer: Self-pay | Admitting: Pediatrics

## 2024-10-22 DIAGNOSIS — R9430 Abnormal result of cardiovascular function study, unspecified: Secondary | ICD-10-CM

## 2024-10-22 DIAGNOSIS — E782 Mixed hyperlipidemia: Secondary | ICD-10-CM

## 2024-10-27 ENCOUNTER — Ambulatory Visit
Admission: RE | Admit: 2024-10-27 | Discharge: 2024-10-27 | Disposition: A | Discharge status: Home / Self Care | Payer: Self-pay | Source: Ambulatory Visit | Attending: Pediatrics | Admitting: Pediatrics

## 2024-10-27 DIAGNOSIS — E782 Mixed hyperlipidemia: Secondary | ICD-10-CM | POA: Insufficient documentation

## 2024-10-27 DIAGNOSIS — R9430 Abnormal result of cardiovascular function study, unspecified: Secondary | ICD-10-CM | POA: Insufficient documentation
